# Patient Record
Sex: Female | Born: 1945 | Race: Black or African American | Hispanic: No | Marital: Single | Smoking: Never smoker
Health system: Southern US, Community
[De-identification: ages and names within clinical notes are randomized; demographics above are authoritative.]

## PROBLEM LIST (undated history)

## (undated) DIAGNOSIS — K219 Gastro-esophageal reflux disease without esophagitis: Secondary | ICD-10-CM

## (undated) DIAGNOSIS — H9190 Unspecified hearing loss, unspecified ear: Secondary | ICD-10-CM

## (undated) DIAGNOSIS — I10 Essential (primary) hypertension: Secondary | ICD-10-CM

## (undated) HISTORY — PX: TUBAL LIGATION: SHX77

---

## 2008-05-04 ENCOUNTER — Emergency Department (HOSPITAL_COMMUNITY): Admission: EM | Admit: 2008-05-04 | Discharge: 2008-05-04 | Payer: Self-pay | Admitting: Emergency Medicine

## 2010-04-18 ENCOUNTER — Emergency Department (HOSPITAL_COMMUNITY): Admission: EM | Admit: 2010-04-18 | Discharge: 2010-04-18 | Payer: Self-pay | Admitting: Emergency Medicine

## 2010-04-24 ENCOUNTER — Encounter: Admission: RE | Admit: 2010-04-24 | Discharge: 2010-04-24 | Payer: Self-pay | Admitting: Family Medicine

## 2011-02-19 LAB — URINALYSIS, ROUTINE W REFLEX MICROSCOPIC
Glucose, UA: NEGATIVE mg/dL
Hgb urine dipstick: NEGATIVE
Protein, ur: NEGATIVE mg/dL
Specific Gravity, Urine: 1.008 (ref 1.005–1.030)
pH: 6 (ref 5.0–8.0)

## 2011-02-19 LAB — URINE MICROSCOPIC-ADD ON

## 2011-08-30 LAB — COMPREHENSIVE METABOLIC PANEL
AST: 21
BUN: 5 — ABNORMAL LOW
CO2: 25
GFR calc Af Amer: >60
Glucose, Bld: 98
Sodium: 139
Total Bilirubin: 0.7

## 2011-08-30 LAB — DIFFERENTIAL
Basophils Relative: 1
Eosinophils Relative: 0
Lymphocytes Relative: 15
Monocytes Absolute: 0.2
Monocytes Relative: 2 — ABNORMAL LOW

## 2011-08-30 LAB — URINALYSIS, ROUTINE W REFLEX MICROSCOPIC
Ketones, ur: NEGATIVE
Specific Gravity, Urine: 1.001 — ABNORMAL LOW
pH: 7

## 2011-08-30 LAB — CBC
Hemoglobin: 14.1
MCHC: 33.4
RDW: 14.2

## 2011-08-30 LAB — POCT CARDIAC MARKERS: Myoglobin, poc: 51.6

## 2011-08-30 LAB — PREGNANCY, URINE: Preg Test, Ur: NEGATIVE

## 2011-08-30 LAB — LIPASE, BLOOD: Lipase: 16

## 2012-03-30 ENCOUNTER — Emergency Department (HOSPITAL_COMMUNITY)
Admission: EM | Admit: 2012-03-30 | Discharge: 2012-03-30 | Disposition: A | Discharge status: Home / Self Care | Payer: Medicare Other | Attending: Emergency Medicine | Admitting: Emergency Medicine

## 2012-03-30 ENCOUNTER — Encounter (HOSPITAL_COMMUNITY): Payer: Self-pay | Admitting: Emergency Medicine

## 2012-03-30 DIAGNOSIS — H209 Unspecified iridocyclitis: Secondary | ICD-10-CM | POA: Insufficient documentation

## 2012-03-30 DIAGNOSIS — I10 Essential (primary) hypertension: Secondary | ICD-10-CM | POA: Insufficient documentation

## 2012-03-30 DIAGNOSIS — K219 Gastro-esophageal reflux disease without esophagitis: Secondary | ICD-10-CM | POA: Insufficient documentation

## 2012-03-30 HISTORY — DX: Essential (primary) hypertension: I10

## 2012-03-30 HISTORY — DX: Gastro-esophageal reflux disease without esophagitis: K21.9

## 2012-03-30 MED ORDER — TETRACAINE HCL 0.5 % OP SOLN
OPHTHALMIC | Status: AC
  Administered 2012-03-30: 2 [drp]
  Filled 2012-03-30: qty 2

## 2012-03-30 MED ORDER — ACETAMINOPHEN-CODEINE #3 300-30 MG PO TABS: 1.0000 | ORAL_TABLET | Freq: Four times a day (QID) | ORAL | Status: AC | PRN

## 2012-03-30 MED ORDER — ACETAMINOPHEN-CODEINE #3 300-30 MG PO TABS
1.0000 | ORAL_TABLET | Freq: Once | ORAL | Status: AC
  Administered 2012-03-30: 1 via ORAL
  Filled 2012-03-30: qty 1

## 2012-03-30 MED ORDER — FLUORESCEIN SODIUM 1 MG OP STRP
1.0000 | ORAL_STRIP | Freq: Once | OPHTHALMIC | Status: DC
  Filled 2012-03-30: qty 1

## 2012-03-30 MED ORDER — BACITRA-NEOMYCIN-POLYMYXIN-HC 1 % OP OINT: TOPICAL_OINTMENT | Freq: Two times a day (BID) | OPHTHALMIC | Status: AC

## 2012-03-30 NOTE — ED Notes (Addendum)
Interpreter at bedside. Discharge instructions explained, pt to follow up with [facility]  tomorrow. Had no further questions.

## 2012-03-30 NOTE — Discharge Instructions (Signed)
Call Dr. Huel Coventry office first thing in the morning to schedule close followup appointment tomorrow. Explain that you were seen in the emergency department and that Dr. Burgess Estelle wanted you to be seen tomorrow. Use eyedrops as directed. Use Tylenol #3 for pain. Return to emergency department for any emergent changing or worsening of symptoms.  Iritis Iritis is an inflammation of the colored part of the eye (iris). Other parts at the front of the eye may also be inflamed. The iris is part of the middle layer of the eyeball which is called the uvea or the uveal track. Any part of the uveal track can become inflamed. The other portions of the uveal track are the choroid (the thin membrane under the outer layer of the eye), and the ciliary body (joins the choroid and the iris and produces the fluid in the front of the eye).  It is extremely important to treat iritis early, as it may lead to internal eye damage causing scarring or diseases such as glaucoma. Some people have only one attack of iritis (in one or both eyes) in their lifetime, while others may get it many times. CAUSES Iritis can be associated with many different diseases, but mostly occurs in otherwise healthy people. Examples of diseases that can be associated with iritis include:  Diseases where the body's immune system attacks tissues within your own body (autoimmune diseases).   Infections (tuberculosis, gonorrhea, fungus infections, Lyme disease, infection of the lining of the heart).   Trauma or injury.   Eye diseases (acute glaucoma and others).   Inflammation from other parts of the uveal track.   Severe eye infections.   Other rare diseases.  SYMPTOMS  Eye pain or aching.   Sensitivity to light.   Loss of sight or blurred vision.   Redness of the eye. This is often accompanied by a ring of redness around the outside of the cornea, or clear covering at the front of the eye (ciliary flush).   Excessive tearing of the  eye(s).   A small pupil that does not enlarge in the dark and stays smaller than the other eye's pupil.   A whitish area that obscures the lower part of the colored circular iris. Sometimes this is visible when looking at the eye, where the whitish area has a "fluid level" or flat top. This is called a "hypopyon" and is actually pus inside the eye.  Since iritis causes the eye to become red, it is often confused with a much less dangerous form of "pink eye" or conjunctivitis. One of the most important symptoms is sensitivity to light. Anytime there is redness, discomfort in the eye(s) and extreme light sensitivity, it is extremely important to see an ophthalmologist as soon as possible. TREATMENT Acute iritis requires prompt medical evaluation by an eye specialist (ophthalmologist.) Treatment depends on the underlying cause but may include:  Corticosteroid eye drops and dilating eye drops. Follow your caregiver's exact instructions on taking and stopping corticosteroid medications (drops or pills).   Occasionally, the iritis will be so severe that it will not respond to commonly used medications. If this happens, it may be necessary to use steroid injections. The injections are given under the eye's outer surface. Sometimes oral medications are given. The decision on treatment used for iritis is usually made on an individual basis.  HOME CARE INSTRUCTIONS Your care giver will give specific instructions regarding the use of eye medications or other medications. Be certain to follow all instructions in both  taking and stopping the medications. SEEK IMMEDIATE MEDICAL CARE IF:  You have redness of one or both eye.   You experience a great deal of light sensitivity.   You have pain or aching in either eye.  MAKE SURE YOU:   Understand these instructions.   Will watch your condition.   Will get help right away if you are not doing well or get worse.  Document Released: 11/19/2005 Document  Revised: 11/08/2011 Document Reviewed: 05/09/2007 [facility] Patient Information 2012 [place], [place].

## 2012-03-30 NOTE — ED Provider Notes (Signed)
History     CSN: [id number]  Arrival date & time 03/30/12  4401   First MD Initiated Contact with Patient 03/30/12 0602      Chief Complaint  Patient presents with  . Eye Pain    (Consider location/radiation/quality/duration/timing/severity/associated sxs/prior treatment) HPI  Patient presents to ER complaining of right eye irritation, redness and pain. 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 Patient states that yesterday she began to have some "itching in eye" and that it progressed and worsened throughout the day with increasing pain as the day progressed. Patient denies known injury to eye. Patient states she recently moved by to [facility] from [facility] but that she has been having "wavey" vision since 1985 and while in [place] in 2010 she had similar symptoms where she had increase in the "wavey" vision associated with pain, itching and watery eye and states she went to a "rehab place for deaf and blind and had a lot of eye tests done and then got new glasses." Patient states symptoms improved over time but that she no longer has those eye glasses, rather a pair she bought at [employer]. She states that when irritation and pain began yesterday, the wavy vision became worse and she felt like she was "seeing hazy like ghost like spots in vision." Patient states eye waters clear drainage. She does not have an eye doctor in [place]. Patient complains that pain in eye is irritated by sun light. Denies pain or irritation in left eye.   Past Medical History  Diagnosis Date  . GERD (gastroesophageal reflux disease)   . Hypertension     No past surgical history on file.  No family history on file.  History  Substance Use Topics  . Smoking status: Not on file  . Smokeless tobacco: Not on file  . Alcohol Use:     OB History    Grav Para Term Preterm Abortions TAB SAB Ect Mult Living                  Review of Systems  All other systems  reviewed and are negative.    Allergies  Aspirin; Ciprofloxacin; and Ibuprofen  Home Medications   Current Outpatient Rx  Name Route Sig Dispense Refill  . LISINOPRIL 10 MG PO TABS Oral Take 10 mg by mouth daily.    Marland Kitchen METOPROLOL TARTRATE 25 MG PO TABS Oral Take 25 mg by mouth 2 (two) times daily.    . NEOMYCIN-POLYMYXIN-HC 3.5-10000-1 OP SUSP  1 drop every 6 (six) hours.    Marland Kitchen NITROGLYCERIN 0.4 MG SL SUBL Sublingual Place 0.4 mg under the tongue every 5 (five) minutes as needed. For chest pain    . OMEPRAZOLE 20 MG PO CPDR Oral Take 20 mg by mouth daily.      BP 146/93  Pulse 89  Temp(Src) 98.3 F (36.8 C) (Oral)  Resp 18  SpO2 98%  Physical Exam  Nursing note and vitals reviewed. Constitutional: She is oriented to person, place, and time. She appears well-developed and well-nourished. No distress.  HENT:  Head: Normocephalic and atraumatic.  Eyes: EOM are normal. Pupils are equal, round, and reactive to light. Right eye exhibits discharge. Right eye exhibits no chemosis and no exudate. No foreign body present in the right eye. Left eye exhibits no chemosis, no discharge, no exudate and no hordeolum. No foreign body present in the left eye. Right conjunctiva is injected. Right conjunctiva has no hemorrhage. Left  conjunctiva is not injected. Left conjunctiva has no hemorrhage.  Fundoscopic exam:      The right eye shows no exudate and no hemorrhage.       The left eye shows no exudate and no hemorrhage.  Slit lamp exam:      The right eye shows no corneal abrasion, no corneal flare and no fluorescein uptake.       Pressure of right eye measured twice at 18.   20/40 vision bilaterally.   Neck: Normal range of motion. Neck supple.  Cardiovascular: Normal rate, regular rhythm, normal heart sounds and intact distal pulses.  Exam reveals no gallop and no friction rub.   No murmur heard. Pulmonary/Chest: Effort normal and breath sounds normal. No respiratory distress. She has no  wheezes. She has no rales. She exhibits no tenderness.  Musculoskeletal: Normal range of motion.  Neurological: She is alert and oriented to person, place, and time.  Skin: Skin is warm and dry. No rash noted. She is not diaphoretic. No erythema.  Psychiatric: She has a normal mood and affect.    ED Course  Procedures (including critical care time)  7:29 AM Dr. Bebe Shaggy to bedside for additional evaluation. I spoke with Dr. Burgess Estelle who states that with normal eye pressure, equal vision bilaterally, no corneal abrasion or FB seen without signs or symptoms of acute glaucoma in the setting of a multiple year hx of "wavey" vision that she is appropriate to close follow up in office tomorrow  The interpreter at bedside states that she knows the patient from living in [place] prior to moving to [facility] and that she and her agency have worked with patient on numerous occasions. Patient voices her understanding that she will have her living facility aid her in calling Dr. Huel Coventry office first thing in the morning to establish f/u appt tomorrow. Interpretor states that agency will assist in visit tomorrow. Dr. Burgess Estelle took patient's information.   Will send patient out with new cortisporin drops since that is the medication she was on in 2010 for same symptoms after eye follow up.   Labs Reviewed - No data to display No results found.   1. Iritis       MDM  See ED course. No signs or symptoms of acute glaucoma. No obvious infectious source. Question early iritis vs mild conjunctivitis with chronic vision changes.         [place]ness [place], [place] 03/30/12 1109

## 2012-03-30 NOTE — ED Provider Notes (Signed)
Pt seen with PA She has right eye pain/redness No consensual pain, IOP less than 20 but does have some conjunctival erythema Will need eye followup  Joya Gaskins, MD 03/30/12 [phone]

## 2012-03-30 NOTE — ED Notes (Signed)
PER EMS- States eye became irritated yesterday. Pain and swelling has progressed until now. Eye is inflamed and red. History of HTN, GERD.

## 2012-03-31 NOTE — ED Provider Notes (Signed)
Medical screening examination/treatment/procedure(s) were conducted as a shared visit with non-physician practitioner(s) and myself.  I personally evaluated the patient during the encounter   Joya Gaskins, MD 03/31/12 [phone]

## 2012-09-30 ENCOUNTER — Observation Stay (HOSPITAL_COMMUNITY)
Admission: EM | Admit: 2012-09-30 | Discharge: 2012-10-02 | Disposition: A | Discharge status: Home / Self Care | Payer: Medicare Other | Attending: Family Medicine | Admitting: Family Medicine

## 2012-09-30 ENCOUNTER — Encounter (HOSPITAL_COMMUNITY): Payer: Self-pay | Admitting: Neurology

## 2012-09-30 ENCOUNTER — Emergency Department (HOSPITAL_COMMUNITY): Payer: Medicare Other

## 2012-09-30 DIAGNOSIS — R079 Chest pain, unspecified: Principal | ICD-10-CM

## 2012-09-30 DIAGNOSIS — R112 Nausea with vomiting, unspecified: Secondary | ICD-10-CM | POA: Insufficient documentation

## 2012-09-30 DIAGNOSIS — H9190 Unspecified hearing loss, unspecified ear: Secondary | ICD-10-CM | POA: Insufficient documentation

## 2012-09-30 DIAGNOSIS — R197 Diarrhea, unspecified: Secondary | ICD-10-CM | POA: Insufficient documentation

## 2012-09-30 DIAGNOSIS — K219 Gastro-esophageal reflux disease without esophagitis: Secondary | ICD-10-CM

## 2012-09-30 LAB — URINALYSIS, ROUTINE W REFLEX MICROSCOPIC
Bilirubin Urine: NEGATIVE
Glucose, UA: NEGATIVE mg/dL
Specific Gravity, Urine: 1.003 — ABNORMAL LOW (ref 1.005–1.030)
Urobilinogen, UA: 0.2 mg/dL (ref 0.0–1.0)

## 2012-09-30 LAB — CBC WITH DIFFERENTIAL/PLATELET
Basophils Relative: 0 % (ref 0–1)
Eosinophils Absolute: 0 10*3/uL (ref 0.0–0.7)
Eosinophils Relative: 0 % (ref 0–5)
HCT: 41.8 % (ref 36.0–46.0)
MCH: 31.4 pg (ref 26.0–34.0)
MCV: 91.9 fL (ref 78.0–100.0)
Monocytes Absolute: 0.3 10*3/uL (ref 0.1–1.0)
WBC: 7.6 10*3/uL (ref 4.0–10.5)

## 2012-09-30 LAB — COMPREHENSIVE METABOLIC PANEL
AST: 16 U/L (ref 0–37)
BUN: 16 mg/dL (ref 6–23)
CO2: 26 mEq/L (ref 19–32)
Calcium: 9.4 mg/dL (ref 8.4–10.5)
Chloride: 106 mEq/L (ref 96–112)
Glucose, Bld: 89 mg/dL (ref 70–99)
Sodium: 141 mEq/L (ref 135–145)
Total Bilirubin: 0.2 mg/dL — ABNORMAL LOW (ref 0.3–1.2)
Total Protein: 7.9 g/dL (ref 6.0–8.3)

## 2012-09-30 LAB — TROPONIN I: Troponin I: <0.3 ng/mL (ref <0.30)

## 2012-09-30 LAB — CBC
HCT: 40.5 % (ref 36.0–46.0)
Hemoglobin: 14 g/dL (ref 12.0–15.0)
MCV: 91.2 fL (ref 78.0–100.0)
RBC: 4.44 MIL/uL (ref 3.87–5.11)
RDW: 13.3 % (ref 11.5–15.5)
WBC: 8.1 10*3/uL (ref 4.0–10.5)

## 2012-09-30 LAB — POCT I-STAT TROPONIN I: Troponin i, poc: 0.01 ng/mL (ref 0.00–0.08)

## 2012-09-30 LAB — URINE MICROSCOPIC-ADD ON

## 2012-09-30 LAB — HEMOGLOBIN A1C: Mean Plasma Glucose: 120 mg/dL — ABNORMAL HIGH (ref <117)

## 2012-09-30 MED ORDER — ONDANSETRON HCL 4 MG/2ML IJ SOLN: 4.0000 mg | Freq: Four times a day (QID) | INTRAMUSCULAR | Status: DC | PRN

## 2012-09-30 MED ORDER — ONDANSETRON HCL 4 MG/2ML IJ SOLN
4.0000 mg | Freq: Once | INTRAMUSCULAR | Status: AC
  Administered 2012-09-30: 4 mg via INTRAVENOUS
  Filled 2012-09-30: qty 2

## 2012-09-30 MED ORDER — MORPHINE SULFATE 4 MG/ML IJ SOLN
4.0000 mg | Freq: Once | INTRAMUSCULAR | Status: AC
  Administered 2012-09-30: 4 mg via INTRAVENOUS
  Filled 2012-09-30: qty 1

## 2012-09-30 MED ORDER — OXYBUTYNIN CHLORIDE 5 MG PO TABS
5.0000 mg | ORAL_TABLET | Freq: Three times a day (TID) | ORAL | Status: DC
  Administered 2012-09-30 – 2012-10-02 (×6): 5 mg via ORAL
  Filled 2012-09-30 (×10): qty 1

## 2012-09-30 MED ORDER — MORPHINE SULFATE 2 MG/ML IJ SOLN: 1.0000 mg | INTRAMUSCULAR | Status: DC | PRN

## 2012-09-30 MED ORDER — ONDANSETRON HCL 4 MG PO TABS: 4.0000 mg | ORAL_TABLET | Freq: Four times a day (QID) | ORAL | Status: DC | PRN

## 2012-09-30 MED ORDER — LISINOPRIL 10 MG PO TABS
10.0000 mg | ORAL_TABLET | Freq: Every day | ORAL | Status: DC
  Administered 2012-09-30 – 2012-10-01 (×2): 10 mg via ORAL
  Filled 2012-09-30 (×3): qty 1

## 2012-09-30 MED ORDER — METOPROLOL SUCCINATE ER 25 MG PO TB24
25.0000 mg | ORAL_TABLET | Freq: Every day | ORAL | Status: DC
  Administered 2012-09-30: 25 mg via ORAL
  Filled 2012-09-30 (×3): qty 1

## 2012-09-30 MED ORDER — ONDANSETRON HCL 4 MG/2ML IJ SOLN: 4.0000 mg | Freq: Three times a day (TID) | INTRAMUSCULAR | Status: AC | PRN

## 2012-09-30 MED ORDER — SODIUM CHLORIDE 0.9 % IJ SOLN
3.0000 mL | Freq: Two times a day (BID) | INTRAMUSCULAR | Status: DC
  Administered 2012-09-30 – 2012-10-01 (×3): 3 mL via INTRAVENOUS

## 2012-09-30 MED ORDER — GI COCKTAIL ~~LOC~~
30.0000 mL | Freq: Once | ORAL | Status: AC
  Administered 2012-09-30: 30 mL via ORAL
  Filled 2012-09-30: qty 30

## 2012-09-30 MED ORDER — ACETAMINOPHEN 650 MG RE SUPP: 650.0000 mg | Freq: Four times a day (QID) | RECTAL | Status: DC | PRN

## 2012-09-30 MED ORDER — LEVALBUTEROL HCL 0.63 MG/3ML IN NEBU
0.6300 mg | INHALATION_SOLUTION | Freq: Four times a day (QID) | RESPIRATORY_TRACT | Status: DC | PRN
  Filled 2012-09-30: qty 3

## 2012-09-30 MED ORDER — ACETAMINOPHEN 325 MG PO TABS
650.0000 mg | ORAL_TABLET | Freq: Four times a day (QID) | ORAL | Status: DC | PRN
  Filled 2012-09-30: qty 2

## 2012-09-30 MED ORDER — PANTOPRAZOLE SODIUM 40 MG PO TBEC
40.0000 mg | DELAYED_RELEASE_TABLET | Freq: Every day | ORAL | Status: DC
  Administered 2012-09-30 – 2012-10-02 (×3): 40 mg via ORAL
  Filled 2012-09-30 (×3): qty 1

## 2012-09-30 MED ORDER — ENOXAPARIN SODIUM 40 MG/0.4ML ~~LOC~~ SOLN
40.0000 mg | SUBCUTANEOUS | Status: DC
  Administered 2012-09-30 – 2012-10-01 (×2): 40 mg via SUBCUTANEOUS
  Filled 2012-09-30 (×3): qty 0.4

## 2012-09-30 NOTE — ED Provider Notes (Signed)
History     CSN: [id number]  Arrival date & time 09/30/12  1000   First MD Initiated Contact with Patient 09/30/12 1019      Chief Complaint  Patient presents with  . Chest Pain    (Consider location/radiation/quality/duration/timing/severity/associated sxs/prior treatment) HPI History obtain with help from sign language interpreter.   Chest pain today. EMS called. Pain described as tightness, 7/10 in intensity. The location of the patient's problem is central chest with no radiation.  Onset was last night. Got worse today. Some improvment since that time.   Modifying factors: better with nitro.  Associated symptoms: Also had some chest pain last night with some vomiting after eating. Not as bad then as this morning.   Past Medical History  Diagnosis Date  . GERD (gastroesophageal reflux disease)   . Hypertension     History reviewed. No pertinent past surgical history.  No family history on file.  History  Substance Use Topics  . Smoking status: Never Smoker   . Smokeless tobacco: Not on file  . Alcohol Use: No    OB History    Grav Para Term Preterm Abortions TAB SAB Ect Mult Living                  Review of Systems Negative for respiratory distress, cough. Positive for vomiting, negative diarrhea.  All other systems reviewed and negative unless noted in HPI.    Allergies  Aspirin; Ciprofloxacin; and Ibuprofen  Home Medications   Current Outpatient Rx  Name Route Sig Dispense Refill  . LISINOPRIL 10 MG PO TABS Oral Take 10 mg by mouth daily.    Marland Kitchen METOPROLOL TARTRATE 25 MG PO TABS Oral Take 25 mg by mouth 2 (two) times daily.    . NEOMYCIN-POLYMYXIN-HC 3.5-10000-1 OP SUSP  1 drop every 6 (six) hours.    Marland Kitchen NITROGLYCERIN 0.4 MG SL SUBL Sublingual Place 0.4 mg under the tongue every 5 (five) minutes as needed. For chest pain    . OMEPRAZOLE 20 MG PO CPDR Oral Take 20 mg by mouth daily.      BP 151/105  Pulse 89  Temp 99.1 F (37.3 C) (Oral)  Resp 16   SpO2 98%  Physical Exam Nursing note and vitals reviewed.  Constitutional: Pt is alert and appears stated age. Oropharynx: Airway open without erythema or exudate. Respiratory: No respiratory distress. Equal breathing bilaterally. CV: Extremities warm and well perfused. Neuro: No motor nor sensory deficit. Head: Normocephalic and atraumatic. Eyes: No conjunctivitis, no scleral icterus. Neck: Supple, no mass. Chest: Non-tender. Abdomen: Soft, non-tender MSK: Extremities are atraumatic without deformity. Skin: No rash, no wounds.  ED Course  Procedures (including critical care time)  Labs Reviewed  CBC WITH DIFFERENTIAL - Abnormal; Notable for the following:    Neutrophils Relative 83 (*)     All other components within normal limits  COMPREHENSIVE METABOLIC PANEL - Abnormal; Notable for the following:    Alkaline Phosphatase 121 (*)     Total Bilirubin 0.2 (*)     GFR calc non Af Amer 88 (*)     All other components within normal limits  MAGNESIUM  POCT I-STAT TROPONIN I   Dg Chest Portable 1 View  09/30/2012  *RADIOLOGY REPORT*  Clinical Data: Chest pain.  PORTABLE CHEST - 1 VIEW  Comparison: No priors.  Findings: Lung volumes are normal.  No consolidative airspace disease.  No pleural effusions.  No pneumothorax.  No pulmonary nodule or mass noted.  Pulmonary vasculature  and the cardiomediastinal silhouette are within normal limits.  IMPRESSION: 1. No radiographic evidence of acute cardiopulmonary disease.   Original Report Authenticated By: Florencia Reasons, M.D.      1. Chest pain       MDM  66 y.o. female here with chest pain.  Pertinent past problems include GERD, HTN. Doubt PE, dissection, esophageal rupture. Concerned for ACS. Pain may be GI related.   Medications/interventions:  GI cocktail, IV morphine, IV zofran.  Data reviewed: EKG ordered and interpreted by me: NSR, RBBB, nonspecific ST-T wave changes and new RBBB when compared to 05/04/2008.  Lab  tests ordered and reviewed by me: CXR without acute disease. I independently viewed the following imaging studies and reviewed radiology's interpretation as summarized: troponin low, CBC, CMP unremarkable.  Course of care: TIMI 0 but Heart score of 5. On re-eval, pt reports being chest pain free. Discussed results and recommendation for admission. Pt agreeable. Hospitalist called. Plan for admission to tele.   Medical Decision Making discussed with ED attending Gerhard Munch, MD          Charm Barges, MD 09/30/12 [phone]

## 2012-09-30 NOTE — H&P (Signed)
[facility] History and Physical  Janet Moore:[id number] DOB: 28-Jan-1946 DOA: 09/30/2012  Referring physician: Charm Barges, MD  PCP: Lolita Patella, MD   Chief Complaint: Chest Pain    HPI:  66 yr old F who comes in with Pain described as tightness, 7/10 in intensity. The location of the patient's problem is central chest with no radiation. Onset was last night. Got worse today.Associated with nausea and vomitting , somewhat: better with nitro. Difficult to communicate  And get better hx as the patient is mute and deaf. She   Has a low grade fever in the ED . Workup essentially negative . No hx of CAD .TIMI 0 but Heart score of 5. On re-eval, pt reports being chest pain free      Review of Systems: negative for the following  Constitutional: Denies fever, chills, diaphoresis, appetite change and fatigue.  HEENT: Denies photophobia, eye pain, redness, hearing loss, ear pain, congestion, sore throat, rhinorrhea, sneezing, mouth sores, trouble swallowing, neck pain, neck stiffness and tinnitus.  Respiratory: Denies SOB, DOE, cough, chest tightness, and wheezing.  Cardiovascular: Denies chest pain, palpitations and leg swelling.  Gastrointestinal: Denies nausea, vomiting, abdominal pain, diarrhea, constipation, blood in stool and abdominal distention.  Genitourinary: Denies dysuria, urgency, frequency, hematuria, flank pain and difficulty urinating.  Musculoskeletal: Denies myalgias, back pain, joint swelling, arthralgias and gait problem.  Skin: Denies pallor, rash and wound.  Neurological: Denies dizziness, seizures, syncope, weakness, light-headedness, numbness and headaches.  Hematological: Denies adenopathy. Easy bruising, personal or family bleeding history  Psychiatric/Behavioral: Denies suicidal ideation, mood changes, confusion, nervousness, sleep disturbance and agitation       Past Medical History  Diagnosis Date  . GERD (gastroesophageal reflux  disease)   . Hypertension      History reviewed. No pertinent past surgical history.    Social History:  reports that she has never smoked. She does not have any smokeless tobacco history on file. She reports that she does not drink alcohol or use illicit drugs.   FAMILY HX REVIEWED AND FOUND TO BE NEGATIVE FOR CAD   Allergies  Allergen Reactions  . Aspirin Other (See Comments)    unknwon  . Ciprofloxacin Other (See Comments)    unknown  . Ibuprofen Other (See Comments)    unknown    No family history on file.   Prior to Admission medications   Medication Sig Start Date End Date Taking? Authorizing Provider  Dexlansoprazole (DEXILANT) 30 MG capsule Take 30 mg by mouth daily.   Yes Historical Provider, MD  lisinopril (PRINIVIL,ZESTRIL) 10 MG tablet Take 10 mg by mouth daily.   Yes Historical Provider, MD     Physical Exam: Filed Vitals:   09/30/12 1008 09/30/12 1203  BP: 151/105 134/69  Pulse: 89 73  Temp: 99.1 F (37.3 C)   TempSrc: Oral   Resp: 16 20  SpO2: 98% 100%     Constitutional: Vital signs reviewed. Patient is a well-developed and well-nourished in no acute distress and cooperative with exam. Alert and oriented x3.  Head: Normocephalic and atraumatic  Ear: TM normal bilaterally  Mouth: no erythema or exudates, MMM  Eyes: PERRL, EOMI, conjunctivae normal, No scleral icterus.  Neck: Supple, Trachea midline normal ROM, No JVD, mass, thyromegaly, or carotid bruit present.  Cardiovascular: RRR, S1 normal, S2 normal, no MRG, pulses symmetric and intact bilaterally  Pulmonary/Chest: CTAB, no wheezes, rales, or rhonchi  Abdominal: Soft. Non-tender, non-distended, bowel sounds are normal, no masses, organomegaly, or guarding present.  GU: no CVA tenderness Musculoskeletal: No joint deformities, erythema, or stiffness, ROM full and no nontender Ext: no edema and no cyanosis, pulses palpable bilaterally (DP and PT)  Hematology: no cervical, inginal, or axillary  adenopathy.  Neurological: A&O x3, Strenght is normal and symmetric bilaterally, cranial nerve II-XII are grossly intact, no focal motor deficit, sensory intact to light touch bilaterally.  Skin: Warm, dry and intact. No rash, cyanosis, or clubbing.  Psychiatric: Normal mood and affect. speech and behavior is normal. Judgment and thought content normal. Cognition and memory are normal.       Labs on Admission:    Basic Metabolic Panel:  Lab 09/30/12 4098  NA 141  K 4.0  CL 106  CO2 26  GLUCOSE 89  BUN 16  CREATININE 0.74  CALCIUM 9.4  MG 2.1  PHOS --   Liver Function Tests:  Lab 09/30/12 1039  AST 16  ALT 14  ALKPHOS 121*  BILITOT 0.2*  PROT 7.9  ALBUMIN 3.6   No results found for this basename: LIPASE:5,AMYLASE:5 in the last 168 hours No results found for this basename: AMMONIA:5 in the last 168 hours CBC:  Lab 09/30/12 1039  WBC 7.6  NEUTROABS 6.4  HGB 14.3  HCT 41.8  MCV 91.9  PLT 253   Cardiac Enzymes: No results found for this basename: CKTOTAL:5,CKMB:5,CKMBINDEX:5,TROPONINI:5 in the last 168 hours  BNP (last 3 results) No results found for this basename: PROBNP:3 in the last 8760 hours    CBG: No results found for this basename: GLUCAP:5 in the last 168 hours  Radiological Exams on Admission: Dg Chest Portable 1 View  09/30/2012  *RADIOLOGY REPORT*  Clinical Data: Chest pain.  PORTABLE CHEST - 1 VIEW  Comparison: No priors.  Findings: Lung volumes are normal.  No consolidative airspace disease.  No pleural effusions.  No pneumothorax.  No pulmonary nodule or mass noted.  Pulmonary vasculature and the cardiomediastinal silhouette are within normal limits.  IMPRESSION: 1. No radiographic evidence of acute cardiopulmonary disease.   Original Report Authenticated By: Florencia Reasons, M.D.     EKG: Independently reviewed. EKG ordered and interpreted by me: NSR, RBBB, nonspecific ST-T wave changes and new RBBB when compared to 05/04/2008.     Assessment/Plan Active Problems:  Chest pain  GERD (gastroesophageal reflux disease)   Chest pain, Doubt PE, dissection, esophageal rupture, could be pleurisy  due to viral URI .will cycle cardiac enzymes , 2 d echo , ALLERGIC TO ASA,   HTN CONTINUE LISINOPRIL , BETABLOCKER ,  GERD will start PPI   Code Status:   full Family Communication: bedside Disposition Plan: admit   Time spent: 70 mins   [facility] Pager [phone]  If 7PM-7AM, please contact night-coverage [email or web address] Password [email or web address] 09/30/2012, 2:18 PM

## 2012-09-30 NOTE — ED Notes (Addendum)
[facility] pt is [facility]. Communicating with EMS via paper and pen. Pt holding chest, EMS unable to get further information. Given 2 nitro, refusing aspirin. Skin warm and dry. EKG showing right bundle branch block. HR 100, BP 192/100. 20 gauge L. AC.  Sign language Interpreter paged. Will continue to monitor.

## 2012-09-30 NOTE — Plan of Care (Signed)
Problem: Phase I Progression Outcomes Goal: Aspirin unless contraindicated Outcome: Not Applicable Date Met:  09/30/12 Pt refused; states she's allergic

## 2012-09-30 NOTE — ED Notes (Addendum)
Pt has many questions about medication refills and medicare questions. EDP in room at this time. Pt reports CP stopped after 2nd dose of SL nitro given by EMS.

## 2012-09-30 NOTE — ED Notes (Signed)
Waiting for sign language interpreter. Communicating with patient with pen and paper. Pt communicated epigastric pain last night after eating, vomiting x 2, diarrhea.

## 2012-10-01 LAB — HEMOGLOBIN A1C
Hgb A1c MFr Bld: 5.6 % (ref <5.7)
Mean Plasma Glucose: 114 mg/dL (ref <117)

## 2012-10-01 LAB — COMPREHENSIVE METABOLIC PANEL
ALT: 10 U/L (ref 0–35)
AST: 17 U/L (ref 0–37)
CO2: 26 mEq/L (ref 19–32)
Calcium: 8.5 mg/dL (ref 8.4–10.5)
Creatinine, Ser: 0.9 mg/dL (ref 0.50–1.10)
GFR calc non Af Amer: 66 mL/min — ABNORMAL LOW (ref >90)
Sodium: 139 mEq/L (ref 135–145)
Total Protein: 6.2 g/dL (ref 6.0–8.3)

## 2012-10-01 LAB — TROPONIN I: Troponin I: <0.3 ng/mL (ref <0.30)

## 2012-10-01 LAB — TSH: TSH: 2.29 u[IU]/mL (ref 0.350–4.500)

## 2012-10-01 MED ORDER — METOPROLOL SUCCINATE 12.5 MG HALF TABLET
12.5000 mg | ORAL_TABLET | Freq: Every day | ORAL | Status: DC
  Administered 2012-10-01 – 2012-10-02 (×2): 12.5 mg via ORAL
  Filled 2012-10-01 (×2): qty 1

## 2012-10-01 MED ORDER — INFLUENZA VIRUS VACC SPLIT PF IM SUSP
0.5000 mL | Freq: Once | INTRAMUSCULAR | Status: AC
  Administered 2012-10-01: 0.5 mL via INTRAMUSCULAR
  Filled 2012-10-01: qty 0.5

## 2012-10-01 NOTE — Progress Notes (Signed)
PROGRESS NOTE  Janet Moore:[id number] DOB: Dec 28, 1945 DOA: 09/30/2012 PCP: Lolita Patella, MD  Brief narrative: 66 yr old female admit 10/29 with Chest pain-on close questioning patient says that she had an episode of enteritis but but 3 days ago and then developed chest pain as well as diarrhea. She had one episode of vomiting and diarrhea that was brown in color not bloody.  This was subsequent to eating a meal of Rice-A-Roni at home. She states and no other sick contacts had this issue. She states she has 0 and 10 chest pain right now. She states that she has been told in the past that she has heart issues when she was in [place].   Past medical history-As per Problem list Chart reviewed as below- Emergency room visits-no other medical history available at present  Consultants:  None  Procedures:  HbA1c 5.6  Chest x-ray 1 view negative  Antibiotics:  None at present   Subjective  She denies any cough any fever but states that she is somewhat dizzy today. She has no chest pain whatsoever. She has run out of her blood pressure medications for the past 2-3 months and has not seen her primary care physician 2 years given she cannot afford the co-pay.   Objective    Interim History: Past uneventful night  Telemetry: Telemetry normal sinus rhythm no red alarms. Limited discontinued 10/01/2012  Objective: Filed Vitals:   09/30/12 1530 09/30/12 1603 09/30/12 2100 10/01/12 0500  BP: 113/66 132/77 99/61 94/61   Pulse: 69 78 66 56  Temp:  98.3 F (36.8 C) 98.5 F (36.9 C) 98.2 F (36.8 C)  TempSrc:      Resp: 19 20 16 16   SpO2: 98% 100% 97% 98%    Intake/Output Summary (Last 24 hours) at 10/01/12 0836 Last data filed at 10/01/12 0500  Gross per 24 hour  Intake    480 ml  Output      0 ml  Net    480 ml    Exam:  General: Alert pleasant oriented Afro-American female-is clinically deaf and history was taken with help of an interpreter at  bedside Cardiovascular: S1-S2 no murmur rub or gallop the PMI displacement Respiratory: Clinically clear no tactile vocal resonance or fremitus Abdomen: Midline lower abdominal scar compatible with hysterectomy Skin soft nontender nondistended, no le edeam or rash Neuro motor grossly normal sensation grossly intact  Data Reviewed: Basic Metabolic Panel:  Lab 10/01/12 9562 09/30/12 1039  NA 139 141  K 3.8 4.0  CL 106 106  CO2 26 26  GLUCOSE 96 89  BUN 20 16  CREATININE 0.90 0.74  CALCIUM 8.5 9.4  MG -- 2.1  PHOS -- --   Liver Function Tests:  Lab 10/01/12 0426 09/30/12 1039  AST 17 16  ALT 10 14  ALKPHOS 109 121*  BILITOT 0.1* 0.2*  PROT 6.2 7.9  ALBUMIN 2.7* 3.6   No results found for this basename: LIPASE:5,AMYLASE:5 in the last 168 hours No results found for this basename: AMMONIA:5 in the last 168 hours CBC:  Lab 09/30/12 1610 09/30/12 1039  WBC 8.1 7.6  NEUTROABS -- 6.4  HGB 14.0 14.3  HCT 40.5 41.8  MCV 91.2 91.9  PLT 245 253   Cardiac Enzymes:  Lab 10/01/12 0426 09/30/12 2144 09/30/12 1609  CKTOTAL -- -- --  CKMB -- -- --  CKMBINDEX -- -- --  TROPONINI <0.30 <0.30 <0.30   BNP: No components found with this basename: POCBNP:5 CBG: No  results found for this basename: GLUCAP:5 in the last 168 hours  No results found for this or any previous visit (from the past 240 hour(s)).   Studies:              All Imaging reviewed and is as per above notation   Scheduled Meds:   . enoxaparin (LOVENOX) injection  40 mg Subcutaneous Q24H  . gi cocktail  30 mL Oral Once  . lisinopril  10 mg Oral Daily  . metoprolol succinate  25 mg Oral Daily  .  morphine injection  4 mg Intravenous Once  . ondansetron (ZOFRAN) IV  4 mg Intravenous Once  . oxybutynin  5 mg Oral TID  . pantoprazole  40 mg Oral Daily  . sodium chloride  3 mL Intravenous Q12H   Continuous Infusions:    Assessment/Plan: 1. Likely gastroenteritis-patient stabilized at present. No role for  an echocardiogram at present time, see below 2. ? 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 I will obtain records from [facility] prior to ordering any further imaging studies. Patient does not have a PCP: 4. This might present a problem in the near future. We'll try to coordinate with case management 3. Borderline hypertension-blood pressures in the 90s over 60s this morning.-This might be secondary to reimplementation of lisinopril 10 mg, metoprolol 25 mg-I. have already change metoprolol to 12.5 XL daily. I do not think patient will be able to afford XL dose, therefore we will give her 12.5 twice a day. We will records from [facility] 4. Question of urinary incontinence-patient has been placed on oxybutynin 5 mg 3 times a day-she'll need urodynamic studies versus urgent continence questionnaire which will be administered to her today 5. Deafness-will need coordination of care  Code Status: Resume full Family Communication: None at bedside Disposition Plan: Likely back to nursing home in the morning.   Pleas Koch, MD  [facility] Pager [phone] 10/01/2012, 8:36 AM    LOS: 1 day

## 2012-10-01 NOTE — Clinical Social Work Psychosocial (Signed)
     Clinical Social Work Department BRIEF PSYCHOSOCIAL ASSESSMENT 10/01/2012  Patient:  Janet Moore, Janet Moore     Account Number:  [id number]     Admit date:  09/30/2012  Clinical Social Worker:  Margaree Mackintosh  Date/Time:  10/01/2012 12:00 M  Referred by:  Physician  Date Referred:  10/01/2012 Referred for  SNF Placement   Other Referral:   Interview type:  Patient Other interview type:   With interpreter present.    PSYCHOSOCIAL DATA Living Status:  ALONE Admitted from facility:   Level of care:   Primary support name:  Unknown. Primary support relationship to patient:   Degree of support available:   Unknown.    CURRENT CONCERNS Current Concerns  Post-Acute Placement   Other Concerns:    SOCIAL WORK ASSESSMENT / PLAN Clinical Social Worker recieved referral indicating pt is from a facility.  CSW reviewed chart and arranged for an interpreter to arrive at the hospital.  CSW met with pt. CSW introduced self, explained role, and provided support. Pt reports she lives alone in an apartment and does not recieve any additional assistance.  Pt inquired about diapers and extra pants.  CSW staffed case with RNCM. CSW to sign off at this time, please re consult if needed.   Assessment/plan status:  Information/Referral to [facility] Other assessment/ plan:   Information/referral to community resources:   [facility]    PATIENTS/FAMILYS RESPONSE TO PLAN OF CARE: Pt was pleasant and engaged in conversation.  Pt thanked CSW for intervention.

## 2012-10-01 NOTE — ED Provider Notes (Signed)
  I performed a history and physical examination of Janet Moore and discussed her management with Dr. Gregary Cromer.  I agree with the history, physical, assessment, and plan of care, with the following exceptions: None  I saw the ECG, relevant labs and studies - I agree with the interpretation.   Elyse Jarvis, MD 10/01/12 [phone]

## 2012-10-02 MED ORDER — LISINOPRIL 5 MG PO TABS
5.0000 mg | ORAL_TABLET | Freq: Every day | ORAL | Status: DC
  Administered 2012-10-02: 5 mg via ORAL
  Filled 2012-10-02: qty 1

## 2012-10-02 MED ORDER — OXYBUTYNIN CHLORIDE 5 MG PO TABS: 5.0000 mg | ORAL_TABLET | Freq: Three times a day (TID) | ORAL | Status: DC

## 2012-10-02 NOTE — Care Management Note (Signed)
    Page 1 of 1   10/02/2012     1:43:34 PM   CARE MANAGEMENT NOTE 10/02/2012  Patient:  Janet Moore, Janet Moore   Account Number:  [id number]  Date Initiated:  09/30/2012  Documentation initiated by:  GRAVES-BIGELOW,Janet Moore  Subjective/Objective Assessment:   Pt admitted with cp.     Action/Plan:   CM will continue to monitor for disposition needs.   Anticipated DC Date:  10/01/2012   Anticipated DC Plan:  HOME/SELF CARE      DC Planning Services  CM consult      Choice offered to / List presented to:             Status of service:  Completed, signed off Medicare Important Message given?   (If response is "NO", the following Medicare IM given date fields will be blank) Date Medicare IM given:   Date Additional Medicare IM given:    Discharge Disposition:  HOME/SELF CARE  Per UR Regulation:  Reviewed for med. necessity/level of care/duration of stay  If discussed at Long Length of Stay Meetings, dates discussed:    Comments:  10-02-12 1538 Janet Muller, Janet Moore,Janet Moore [phone] CM did speak to pt at length with interpreter in room. Pt is not agreeable to hh services of Janet Moore and SW. CM discussed medicaitons with pt and that she needs to f/u with MD Janet Moore. Pt has a blanace that she is paying down and the office will still help her to make an appintment. CM did speak to Janet Moore her therapist from [facility] [phone]. She was willing to pick the pt up and provide transportation home, however pt refused. Janet Moore will meet the pt at home and she will help her get her medicaions. Janet Moore was contacted for bus pass and clothing. No furhter needs from CM at this time.

## 2012-10-02 NOTE — Discharge Summary (Signed)
Physician Discharge Summary  Janet Moore:[id number] DOB: 08-30-1946 DOA: 09/30/2012  PCP: Lolita Patella, MD  Admit date: 09/30/2012 Discharge date: 10/02/2012  Time spent: 20 minutes  Recommendations for Outpatient Follow-up:  1. Needs BMEt and CBC in about 1 month 2. Needs coordination with PCP 3. Would NOT work up CP any further without her old notes from [place] where she was apparently hospitalized in 2011  Discharge Diagnoses:  Active Problems:  Chest pain  GERD (gastroesophageal reflux disease)   Discharge Condition: good  Diet recommendation: regular  Filed Weights   10/01/12 1533  Weight: 82.781 kg (182 lb 8 oz)    History of present illness:  66 yr old AAF admitted with likley non-cardiogenic CP on 10/29. On closer questionnng she admitted to having  an episode of enteritis about  3 days prior to admit and then developed chest pain as well as diarrhea. She had one episode of vomiting and diarrhea that was brown in color not bloody. This was subsequent to eating a meal of Rice-A-Roni at home. She states and no other sick contacts had this issue. She states she has 0 and 10 chest pain right now. She states that she has been told in the past that she has heart issues when she was in [facility] Course:  1. Likely gastroenteritis-patient stabilized at present. No role for an echocardiogram at present time.  Defer to PCP further managment 2. ? 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 This might present a problem in the near future. We'll try to coordinate with case management 3. Borderline hypertension-blood pressures in the 90s over 60s this morning.-This might be secondary to reimplementation of lisinopril 10 mg, metoprolol 25 mg-I. have already change metoprolol to 12.5 XL daily. Given her blood pressures sustained lowe in hospital, she was not  discharged on any htn meds but might need low dose ace implemented as an out-patient 4. Question of urinary incontinence-patient has been placed on oxybutynin 5 mg 3 times a day-she'll need urodynamic studies 5. Deafness-will need coordination of care  Procedures:  See below (i.e. Studies not automatically included, echos, thoracentesis, etc; not x-rays)  Consultations:  none  Discharge Exam: Filed Vitals:   10/01/12 1000 10/01/12 1533 10/01/12 2100 10/02/12 0500  BP: 104/72 101/51 99/54 104/59  Pulse: 88 60 65 65  Temp:  98.1 F (36.7 C) 98.3 F (36.8 C) 98.1 F (36.7 C)  TempSrc:  Oral Oral Oral  Resp:  18    Weight:  82.781 kg (182 lb 8 oz)    SpO2:  99% 99% 100%   Alert pleasant oriented, evaluated c help of interpreter  General: nad, no ict, poor dentition Cardiovascular: s1 s2 no m/r/g Respiratory: clear  Discharge Instructions  Discharge Orders    Future Orders Please Complete By Expires   Diet - low sodium heart healthy      Increase activity slowly      Call MD for:  temperature >100.4      Call MD for:  persistant nausea and vomiting      Call MD for:  severe uncontrolled pain      Call MD for:  redness, tenderness, or signs of infection (pain, swelling, redness, odor or green/yellow discharge around incision site)      Call MD for:  hives      Call MD for:  persistant dizziness or light-headedness  Medication List     As of 10/02/2012 12:35 PM    TAKE these medications         DEXILANT 30 MG capsule   Generic drug: Dexlansoprazole   Take 30 mg by mouth daily.      lisinopril 10 MG tablet   Commonly known as: PRINIVIL,ZESTRIL   Take 10 mg by mouth daily.      oxybutynin 5 MG tablet   Commonly known as: DITROPAN   Take 1 tablet (5 mg total) by mouth 3 (three) times daily.          The results of significant diagnostics from this hospitalization (including imaging, microbiology, ancillary and laboratory) are listed below for  reference.    Significant Diagnostic Studies: Dg Chest Portable 1 View  09/30/2012  *RADIOLOGY REPORT*  Clinical Data: Chest pain.  PORTABLE CHEST - 1 VIEW  Comparison: No priors.  Findings: Lung volumes are normal.  No consolidative airspace disease.  No pleural effusions.  No pneumothorax.  No pulmonary nodule or mass noted.  Pulmonary vasculature and the cardiomediastinal silhouette are within normal limits.  IMPRESSION: 1. No radiographic evidence of acute cardiopulmonary disease.   Original Report Authenticated By: Florencia Reasons, M.D.     Microbiology: No results found for this or any previous visit (from the past 240 hour(s)).   Labs: Basic Metabolic Panel:  Lab 10/01/12 7829 09/30/12 1039  NA 139 141  K 3.8 4.0  CL 106 106  CO2 26 26  GLUCOSE 96 89  BUN 20 16  CREATININE 0.90 0.74  CALCIUM 8.5 9.4  MG -- 2.1  PHOS -- --   Liver Function Tests:  Lab 10/01/12 0426 09/30/12 1039  AST 17 16  ALT 10 14  ALKPHOS 109 121*  BILITOT 0.1* 0.2*  PROT 6.2 7.9  ALBUMIN 2.7* 3.6   No results found for this basename: LIPASE:5,AMYLASE:5 in the last 168 hours No results found for this basename: AMMONIA:5 in the last 168 hours CBC:  Lab 09/30/12 1610 09/30/12 1039  WBC 8.1 7.6  NEUTROABS -- 6.4  HGB 14.0 14.3  HCT 40.5 41.8  MCV 91.2 91.9  PLT 245 253   Cardiac Enzymes:  Lab 10/01/12 0426 09/30/12 2144 09/30/12 1609  CKTOTAL -- -- --  CKMB -- -- --  CKMBINDEX -- -- --  TROPONINI <0.30 <0.30 <0.30   BNP: BNP (last 3 results) No results found for this basename: PROBNP:3 in the last 8760 hours CBG: No results found for this basename: GLUCAP:5 in the last 168 hours     Signed:  Rhetta Mura  [facility] 10/02/2012, 12:35 PM

## 2012-10-02 NOTE — Progress Notes (Signed)
Clinical Social Worker received referral indicating pt in need of bus pass and Building services engineer for dc home.  CSW provided both.  Pt chose not to wear shirt and pulled a shirt from her own bag to wear.  Pt wrote a letter to CSW stating, "I'm Hungry" and "I didn't get grilled cheese Sandwich".  CSW phoned nutritional support who stated pt could have either a salad or a sand which and pt selected a salad-pt's lunch tray still in room.  CSW offered a [place] sandwich from pt fridge, pt declined.  CSW to sign off, please re consult if needed.   Angelia Mould, MSW, [place] [phone]

## 2012-10-02 NOTE — Progress Notes (Signed)
Pt was given Discharge papers interpreter in to explain all discharge orders , S/S of infection s and RX meds ,chest pain and GERD pt understood care follow up

## 2014-04-22 ENCOUNTER — Other Ambulatory Visit: Payer: Self-pay | Admitting: Specialist

## 2014-04-22 DIAGNOSIS — R109 Unspecified abdominal pain: Secondary | ICD-10-CM

## 2014-04-27 ENCOUNTER — Other Ambulatory Visit: Payer: Medicare Other

## 2014-05-04 ENCOUNTER — Other Ambulatory Visit: Payer: Medicare Other

## 2014-05-07 ENCOUNTER — Ambulatory Visit
Admission: RE | Admit: 2014-05-07 | Discharge: 2014-05-07 | Disposition: A | Discharge status: Home / Self Care | Payer: Medicare Other | Source: Ambulatory Visit | Attending: Specialist | Admitting: Specialist

## 2014-05-07 DIAGNOSIS — R109 Unspecified abdominal pain: Secondary | ICD-10-CM

## 2015-03-22 ENCOUNTER — Other Ambulatory Visit: Payer: Self-pay | Admitting: Family Medicine

## 2015-03-22 ENCOUNTER — Ambulatory Visit
Admission: RE | Admit: 2015-03-22 | Discharge: 2015-03-22 | Disposition: A | Discharge status: Home / Self Care | Payer: 59 | Source: Ambulatory Visit | Attending: Family Medicine | Admitting: Family Medicine

## 2015-03-22 DIAGNOSIS — R52 Pain, unspecified: Secondary | ICD-10-CM

## 2015-04-20 ENCOUNTER — Encounter (HOSPITAL_COMMUNITY): Payer: Self-pay | Admitting: *Deleted

## 2015-04-20 ENCOUNTER — Emergency Department (HOSPITAL_COMMUNITY): Payer: Medicare (Managed Care)

## 2015-04-20 ENCOUNTER — Emergency Department (HOSPITAL_COMMUNITY)
Admission: EM | Admit: 2015-04-20 | Discharge: 2015-04-20 | Disposition: A | Discharge status: Home / Self Care | Payer: Medicare (Managed Care) | Attending: Emergency Medicine | Admitting: Emergency Medicine

## 2015-04-20 DIAGNOSIS — I10 Essential (primary) hypertension: Secondary | ICD-10-CM | POA: Diagnosis not present

## 2015-04-20 DIAGNOSIS — M546 Pain in thoracic spine: Secondary | ICD-10-CM | POA: Insufficient documentation

## 2015-04-20 DIAGNOSIS — M549 Dorsalgia, unspecified: Secondary | ICD-10-CM

## 2015-04-20 DIAGNOSIS — R42 Dizziness and giddiness: Secondary | ICD-10-CM | POA: Insufficient documentation

## 2015-04-20 DIAGNOSIS — K219 Gastro-esophageal reflux disease without esophagitis: Secondary | ICD-10-CM | POA: Diagnosis not present

## 2015-04-20 DIAGNOSIS — Z79899 Other long term (current) drug therapy: Secondary | ICD-10-CM | POA: Diagnosis not present

## 2015-04-20 DIAGNOSIS — M542 Cervicalgia: Secondary | ICD-10-CM | POA: Insufficient documentation

## 2015-04-20 DIAGNOSIS — G8929 Other chronic pain: Secondary | ICD-10-CM | POA: Insufficient documentation

## 2015-04-20 LAB — CBC WITH DIFFERENTIAL/PLATELET
BASOS ABS: 0 10*3/uL (ref 0.0–0.1)
Basophils Relative: 0 % (ref 0–1)
EOS ABS: 0 10*3/uL (ref 0.0–0.7)
EOS PCT: 0 % (ref 0–5)
HCT: 46.8 % — ABNORMAL HIGH (ref 36.0–46.0)
Hemoglobin: 15.2 g/dL — ABNORMAL HIGH (ref 12.0–15.0)
LYMPHS PCT: 13 % (ref 12–46)
Lymphs Abs: 1 10*3/uL (ref 0.7–4.0)
MCH: 30.3 pg (ref 26.0–34.0)
MCHC: 32.5 g/dL (ref 30.0–36.0)
MCV: 93.2 fL (ref 78.0–100.0)
Monocytes Absolute: 0.2 10*3/uL (ref 0.1–1.0)
Monocytes Relative: 3 % (ref 3–12)
NEUTROS PCT: 84 % — AB (ref 43–77)
Neutro Abs: 6.5 10*3/uL (ref 1.7–7.7)
PLATELETS: 307 10*3/uL (ref 150–400)
RBC: 5.02 MIL/uL (ref 3.87–5.11)
RDW: 14.3 % (ref 11.5–15.5)
WBC: 7.7 10*3/uL (ref 4.0–10.5)

## 2015-04-20 LAB — I-STAT CHEM 8, ED
BUN: 12 mg/dL (ref 6–20)
CALCIUM ION: 1.14 mmol/L (ref 1.13–1.30)
CHLORIDE: 108 mmol/L (ref 101–111)
CREATININE: 0.7 mg/dL (ref 0.44–1.00)
GLUCOSE: 110 mg/dL — AB (ref 65–99)
HCT: 50 % — ABNORMAL HIGH (ref 36.0–46.0)
Hemoglobin: 17 g/dL — ABNORMAL HIGH (ref 12.0–15.0)
Potassium: 3.6 mmol/L (ref 3.5–5.1)
Sodium: 145 mmol/L (ref 135–145)
TCO2: 22 mmol/L (ref 0–100)

## 2015-04-20 MED ORDER — NAPROXEN 500 MG PO TABS
500.0000 mg | ORAL_TABLET | Freq: Once | ORAL | Status: AC
  Administered 2015-04-20: 500 mg via ORAL
  Filled 2015-04-20: qty 1

## 2015-04-20 MED ORDER — GADOBENATE DIMEGLUMINE 529 MG/ML IV SOLN
20.0000 mL | Freq: Once | INTRAVENOUS | Status: AC | PRN
  Administered 2015-04-20: 17 mL via INTRAVENOUS

## 2015-04-20 MED ORDER — MECLIZINE HCL 25 MG PO TABS
25.0000 mg | ORAL_TABLET | Freq: Once | ORAL | Status: DC
Start: 2015-04-20 — End: 2015-04-20

## 2015-04-20 MED ORDER — SODIUM CHLORIDE 0.9 % IV BOLUS (SEPSIS)
1000.0000 mL | Freq: Once | INTRAVENOUS | Status: AC
  Administered 2015-04-20: 1000 mL via INTRAVENOUS
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
## 2015-04-20 NOTE — ED Provider Notes (Signed)
MSE was initiated and I personally evaluated the patient and placed orders (if any) at  5:35 AM on Apr 20, 2015.  The patient appears stable so that the remainder of the MSE may be completed by another provider. Pt reports in writing she is having neck pain after sleeping on the love seat last night.  She declines medicines or exam until the sign language interpreter arrives.  She is alert and is in no acute distress at this time.   Janet BattiestElizabeth Mehmet Scally, NP 04/20/15 [id number]  Janet Severinlga Otter, MD 04/22/15 [phone]

## 2015-04-20 NOTE — ED Notes (Signed)
Bed: WA17 Expected date: 04/20/15 Expected time: 4:16 AM Means of arrival: Ambulance Comments: Neck pain

## 2015-04-20 NOTE — ED Notes (Signed)
Pt arrives to the ER via EMS for complaints of neck pain; pt is deaf; pt relays thorough EMS that she slept on her neck wrong on the couch and has rt sided neck pain;  Pt denies injury or fall; pt reports that the pain is worse with movement

## 2015-04-20 NOTE — ED Notes (Signed)
[facility] notified for transport 

## 2015-04-20 NOTE — Discharge Instructions (Signed)
Use tylenol or advil for pain. Use heat to your neck and back for 20 minutes every hour as needed for pain. Follow up with neurology in 1 week for recheck of your symptoms, and follow up with [facility] wellness in 1-2 weeks to establish primary care. Return to the ER for changes or worsening symptoms.   Dizziness  Dizziness means you feel unsteady or lightheaded. You might feel like you are going to pass out (faint). HOME CARE   Drink enough fluids to keep your pee (urine) clear or pale yellow.  Take your medicines exactly as told by your doctor. If you take blood pressure medicine, always stand up slowly from the lying or sitting position. Hold on to something to steady yourself.  If you need to stand in one place for a long time, move your legs often. Tighten and relax your leg muscles.  Have someone stay with you until you feel okay.  Do not drive or use heavy machinery if you feel dizzy.  Do not drink alcohol. GET HELP RIGHT AWAY IF:   You feel dizzy or lightheaded and it gets worse.  You feel sick to your stomach (nauseous), or you throw up (vomit).  You have trouble talking or walking.  You feel weak or have trouble using your arms, hands, or legs.  You cannot think clearly or have trouble forming sentences.  You have chest pain, belly (abdominal) pain, sweating, or you are short of breath.  Your vision changes.  You are bleeding.  You have problems from your medicine that seem to be getting worse. MAKE SURE YOU:   Understand these instructions.  Will watch your condition.  Will get help right away if you are not doing well or get worse. Document Released: 11/08/2011 Document Revised: 02/11/2012 Document Reviewed: 11/08/2011 [facility] Patient Information 2015 [place]ExitCare, [place]LL[place]. This information is not intended to replace advice given to you by your health care provider. Make sure you discuss any questions you have with your health care  provider.  Musculoskeletal Pain Musculoskeletal pain is muscle and boney aches and pains. These pains can occur in any part of the body. Your caregiver may treat you without knowing the cause of the pain. They may treat you if blood or urine tests, X-rays, and other tests were normal.  CAUSES There is often not a definite cause or reason for these pains. These pains may be caused by a type of germ (virus). The discomfort may also come from overuse. Overuse includes working out too hard when your body is not fit. Boney aches also come from weather changes. Bone is sensitive to atmospheric pressure changes. HOME CARE INSTRUCTIONS   Ask when your test results will be ready. Make sure you get your test results.  Only take over-the-counter or prescription medicines for pain, discomfort, or fever as directed by your caregiver. If you were given medications for your condition, do not drive, operate machinery or power tools, or sign legal documents for 24 hours. Do not drink alcohol. Do not take sleeping pills or other medications that may interfere with treatment.  Continue all activities unless the activities cause more pain. When the pain lessens, slowly resume normal activities. Gradually increase the intensity and duration of the activities or exercise.  During periods of severe pain, bed rest may be helpful. Lay or sit in any position that is comfortable.  Putting ice on the injured area.  Put ice in a bag.  Place a towel between your skin  and the bag.  Leave the ice on for 15 to 20 minutes, 3 to 4 times a day.  Follow up with your caregiver for continued problems and no reason can be found for the pain. If the pain becomes worse or does not go away, it may be necessary to repeat tests or do additional testing. Your caregiver may need to look further for a possible cause. SEEK IMMEDIATE MEDICAL CARE IF:  You have pain that is getting worse and is not relieved by medications.  You develop  chest pain that is associated with shortness or breath, sweating, feeling sick to your stomach (nauseous), or throw up (vomit).  Your pain becomes localized to the abdomen.  You develop any new symptoms that seem different or that concern you. MAKE SURE YOU:   Understand these instructions.  Will watch your condition.  Will get help right away if you are not doing well or get worse. Document Released: 11/19/2005 Document Revised: 02/11/2012 Document Reviewed: 07/24/2013 [facility] Patient Information 2015 [place]ExitCare, MarylandLLC. This information is not intended to replace advice given to you by your health care provider. Make sure you discuss any questions you have with your health care provider.  Chronic Back Pain  When back pain lasts longer than 3 months, it is called chronic back pain.People with chronic back pain often go through certain periods that are more intense (flare-ups).  CAUSES Chronic back pain can be caused by wear and tear (degeneration) on different structures in your back. These structures include:  The bones of your spine (vertebrae) and the joints surrounding your spinal cord and nerve roots (facets).  The strong, fibrous tissues that connect your vertebrae (ligaments). Degeneration of these structures may result in pressure on your nerves. This can lead to constant pain. HOME CARE INSTRUCTIONS  Avoid bending, heavy lifting, prolonged sitting, and activities which make the problem worse.  Take brief periods of rest throughout the day to reduce your pain. Lying down or standing usually is better than sitting while you are resting.  Take over-the-counter or prescription medicines only as directed by your caregiver. SEEK IMMEDIATE MEDICAL CARE IF:   You have weakness or numbness in one of your legs or feet.  You have trouble controlling your bladder or bowels.  You have nausea, vomiting, abdominal pain, shortness of breath, or fainting. Document Released: 12/27/2004  Document Revised: 02/11/2012 Document Reviewed: 11/03/2011 [facility]Exit[facility] Patient Information 2015 [place]ExitCare, MarylandLLC. This information is not intended to replace advice given to you by your health care provider. Make sure you discuss any questions you have with your health care provider.

## 2015-04-20 NOTE — ED Notes (Signed)
Patient transported to MRI 

## 2015-04-20 NOTE — ED Notes (Signed)
Orthostatic vitals incomplete.  Pt sts to interpreter that she feels dizzy when she sits on the edge of the bed, pt also sts that when the PA ask her to stand she almost faint.  Vitals were done with pt laying and sitting on edge of bed.  PA France RavensMercedes and RN Tiffany notifed

## 2015-04-20 NOTE — ED Notes (Signed)
Pt unable to urinate at this time.  

## 2015-04-20 NOTE — ED Notes (Signed)
Awaiting sign language interpreter

## 2015-06-29 ENCOUNTER — Ambulatory Visit: Payer: Medicare (Managed Care) | Admitting: Neurology

## 2016-01-06 DIAGNOSIS — I10 Essential (primary) hypertension: Secondary | ICD-10-CM | POA: Diagnosis not present

## 2016-01-06 DIAGNOSIS — R5382 Chronic fatigue, unspecified: Secondary | ICD-10-CM | POA: Diagnosis not present

## 2016-01-06 DIAGNOSIS — M79609 Pain in unspecified limb: Secondary | ICD-10-CM | POA: Diagnosis not present

## 2016-01-06 DIAGNOSIS — R6889 Other general symptoms and signs: Secondary | ICD-10-CM | POA: Diagnosis not present

## 2016-01-06 DIAGNOSIS — M1990 Unspecified osteoarthritis, unspecified site: Secondary | ICD-10-CM | POA: Diagnosis not present

## 2016-02-22 DIAGNOSIS — Z743 Need for continuous supervision: Secondary | ICD-10-CM | POA: Diagnosis not present

## 2016-02-22 DIAGNOSIS — J811 Chronic pulmonary edema: Secondary | ICD-10-CM | POA: Diagnosis not present

## 2016-02-22 DIAGNOSIS — K529 Noninfective gastroenteritis and colitis, unspecified: Secondary | ICD-10-CM | POA: Diagnosis not present

## 2016-02-22 DIAGNOSIS — R079 Chest pain, unspecified: Secondary | ICD-10-CM | POA: Diagnosis not present

## 2016-02-22 DIAGNOSIS — R9431 Abnormal electrocardiogram [ECG] [EKG]: Secondary | ICD-10-CM | POA: Diagnosis not present

## 2016-02-22 DIAGNOSIS — H913 Deaf nonspeaking, not elsewhere classified: Secondary | ICD-10-CM | POA: Diagnosis not present

## 2016-02-22 DIAGNOSIS — I4510 Unspecified right bundle-branch block: Secondary | ICD-10-CM | POA: Diagnosis not present

## 2018-06-17 ENCOUNTER — Encounter: Payer: Self-pay | Admitting: Pediatric Intensive Care

## 2018-07-01 ENCOUNTER — Emergency Department (HOSPITAL_COMMUNITY)
Admission: EM | Admit: 2018-07-01 | Discharge: 2018-07-01 | Disposition: A | Discharge status: Home / Self Care | Payer: Medicare HMO | Attending: Emergency Medicine | Admitting: Emergency Medicine

## 2018-07-01 ENCOUNTER — Emergency Department (HOSPITAL_COMMUNITY): Payer: Medicare HMO

## 2018-07-01 ENCOUNTER — Encounter (HOSPITAL_COMMUNITY): Payer: Self-pay

## 2018-07-01 ENCOUNTER — Encounter: Payer: Self-pay | Admitting: Pediatric Intensive Care

## 2018-07-01 DIAGNOSIS — I10 Essential (primary) hypertension: Secondary | ICD-10-CM | POA: Diagnosis not present

## 2018-07-01 DIAGNOSIS — J40 Bronchitis, not specified as acute or chronic: Secondary | ICD-10-CM

## 2018-07-01 DIAGNOSIS — Z79899 Other long term (current) drug therapy: Secondary | ICD-10-CM | POA: Diagnosis not present

## 2018-07-01 DIAGNOSIS — J209 Acute bronchitis, unspecified: Secondary | ICD-10-CM | POA: Diagnosis not present

## 2018-07-01 DIAGNOSIS — R05 Cough: Secondary | ICD-10-CM | POA: Diagnosis present

## 2018-07-01 DIAGNOSIS — J029 Acute pharyngitis, unspecified: Secondary | ICD-10-CM | POA: Diagnosis not present

## 2018-07-01 LAB — COMPREHENSIVE METABOLIC PANEL
ALT: 13 U/L (ref 0–44)
AST: 18 U/L (ref 15–41)
Albumin: 4 g/dL (ref 3.5–5.0)
Alkaline Phosphatase: 114 U/L (ref 38–126)
Anion gap: 6 (ref 5–15)
BUN: 12 mg/dL (ref 8–23)
CHLORIDE: 109 mmol/L (ref 98–111)
CO2: 31 mmol/L (ref 22–32)
CREATININE: 0.82 mg/dL (ref 0.44–1.00)
Calcium: 9.1 mg/dL (ref 8.9–10.3)
GFR calc Af Amer: >60 mL/min (ref >60)
GFR calc non Af Amer: >60 mL/min (ref >60)
GLUCOSE: 106 mg/dL — AB (ref 70–99)
POTASSIUM: 4.2 mmol/L (ref 3.5–5.1)
SODIUM: 146 mmol/L — AB (ref 135–145)
Total Bilirubin: 0.8 mg/dL (ref 0.3–1.2)
Total Protein: 8.7 g/dL — ABNORMAL HIGH (ref 6.5–8.1)

## 2018-07-01 LAB — CBC WITH DIFFERENTIAL/PLATELET
Basophils Absolute: 0 10*3/uL (ref 0.0–0.1)
Basophils Relative: 0 %
Eosinophils Absolute: 0.1 10*3/uL (ref 0.0–0.7)
Eosinophils Relative: 1 %
HCT: 46.7 % — ABNORMAL HIGH (ref 36.0–46.0)
Hemoglobin: 15.5 g/dL — ABNORMAL HIGH (ref 12.0–15.0)
LYMPHS ABS: 2.2 10*3/uL (ref 0.7–4.0)
LYMPHS PCT: 23 %
MCH: 31.2 pg (ref 26.0–34.0)
MCHC: 33.2 g/dL (ref 30.0–36.0)
MCV: 94 fL (ref 78.0–100.0)
Monocytes Absolute: 0.7 10*3/uL (ref 0.1–1.0)
Monocytes Relative: 7 %
Neutro Abs: 6.7 10*3/uL (ref 1.7–7.7)
Neutrophils Relative %: 69 %
PLATELETS: 342 10*3/uL (ref 150–400)
RBC: 4.97 MIL/uL (ref 3.87–5.11)
RDW: 14.7 % (ref 11.5–15.5)
WBC: 9.8 10*3/uL (ref 4.0–10.5)

## 2018-07-01 LAB — GROUP A STREP BY PCR: GROUP A STREP BY PCR: NOT DETECTED

## 2018-07-01 MED ORDER — TRAMADOL HCL 50 MG PO TABS: 50.0000 mg | ORAL_TABLET | Freq: Four times a day (QID) | ORAL | 0 refills | Status: DC | PRN

## 2018-07-01 MED ORDER — TRAMADOL HCL 50 MG PO TABS
50.0000 mg | ORAL_TABLET | Freq: Once | ORAL | Status: AC
  Administered 2018-07-01: 50 mg via ORAL
  Filled 2018-07-01: qty 1

## 2018-07-01 MED ORDER — DM-GUAIFENESIN ER 30-600 MG PO TB12: 1.0000 | ORAL_TABLET | Freq: Two times a day (BID) | ORAL | 1 refills | Status: DC

## 2018-07-01 NOTE — Discharge Instructions (Addendum)
Chest x-ray negative for pneumonia.  Strep test for your throat was negative for strep pharyngitis.  Take the tramadol for the throat pain.  Take the Mucinex DM for the cough and congestion.  Return for any new or worse symptoms or follow-up with your doctor or the wellness clinic.
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
## 2018-07-01 NOTE — ED Notes (Signed)
Patient transported to X-ray 

## 2018-07-01 NOTE — Congregational Nurse Program (Signed)
Congregational Nurse Program Note  Date of Encounter: 07/01/2018  Past Medical History: Past Medical History:  Diagnosis Date  . GERD (gastroesophageal reflux disease)   . Hypertension     Encounter Details: CNP Questionnaire - 07/01/18 0930      Questionnaire   Patient Status  Not Applicable    Race  Black or African American    [facility]  [employer] [facility] [employer]    Insurance  Medicare    Uninsured  Not Applicable    Food  Within past 12 months, worried food would run out with no money to buy more    Housing/Utilities  No permanent housing    Transportation  Yes, need transportation assistance;Provided transportation assistance (bus pass, taxi voucher, etc.)    Interpersonal Safety  No, do not feel physically and emotionally safe where you currently live    Medication  No medication insecurities    Medical Provider  No    Referrals  Emergency Department    ED Visit Averted  Not Applicable    Life-Saving Intervention Made  Not Applicable      Client is hearing impaired. Communication via notes. Client states that she has had a cough since last Monday. It is productive and light yellow. BBS- clear but decreased right side. Client has letter stating that her Medicare provider will be [employer] but coverage does not start until 07/03/2018. CN explained to client that she will help find a new provider but that she needs to be evaluated for cough and elevated BP today. [facility] Cab vouchers provided.

## 2018-07-01 NOTE — ED Notes (Signed)
Bed: [facility]ALD Expected date:  Expected time:  Means of arrival:  Comments: 

## 2018-07-01 NOTE — ED Provider Notes (Signed)
[facility]-EMERGENCY DEPT Provider Note   CSN: [id number] Arrival date & time: 07/01/18  1016     History   Chief Complaint Chief Complaint  Patient presents with  . Cough    HPI Janet Moore is a 72 y.o. female.  Patient presents with cough productive yellow sputum is been going on for about a week.  Patient stays at a homeless shelter.  Patient is deaf patient was communicated with using ASL interpreter.  On the iPad.  Patient states that she is felt feverish.  That she has a sore throat productive cough yellow sputum denies any GI symptoms.     Past Medical History:  Diagnosis Date  . GERD (gastroesophageal reflux disease)   . Hypertension     Patient Active Problem List   Diagnosis Date Noted  . Chest pain 09/30/2012  . GERD (gastroesophageal reflux disease) 09/30/2012    Past Surgical History:  Procedure Laterality Date  . CESAREAN SECTION     x2  . TUBAL LIGATION       OB History   None      Home Medications    Prior to Admission medications   Medication Sig Start Date End Date Taking? Authorizing Provider  amoxicillin (AMOXIL) 500 MG capsule Take 500 mg by mouth 2 (two) times daily.    [provider]  Dexlansoprazole (DEXILANT) 30 MG capsule Take 30 mg by mouth daily.     [provider]  dextromethorphan-guaiFENesin (MUCINEX DM) 30-600 MG 12hr tablet Take 1 tablet by mouth 2 (two) times daily. 07/01/18   Vanetta MuldersZackowski, Teri Diltz, MD  lisinopril (PRINIVIL,ZESTRIL) 10 MG tablet Take 10 mg by mouth daily.    [provider]  oxybutynin (DITROPAN) 5 MG tablet Take 1 tablet (5 mg total) by mouth 3 (three) times daily. Patient not taking: Reported on 04/20/2015 10/02/12   Rhetta MuraSamtani, Jai-Gurmukh, MD  traMADol (ULTRAM) 50 MG tablet Take 1 tablet (50 mg total) by mouth every 6 (six) hours as needed. 07/01/18   Vanetta MuldersZackowski, Carinna Newhart, MD    Family History History reviewed. No pertinent family history.  Social  History Social History   Tobacco Use  . Smoking status: Never Smoker  Substance Use Topics  . Alcohol use: No  . Drug use: No     Allergies   Aspirin; Ciprofloxacin; and Ibuprofen   Review of Systems Review of Systems  Constitutional: Positive for fever.  HENT: Positive for congestion and hearing loss.   Eyes: Negative for redness.  Respiratory: Positive for cough.   Cardiovascular: Negative for chest pain.  Gastrointestinal: Negative for abdominal pain, diarrhea, nausea and vomiting.  Genitourinary: Negative for dysuria.  Musculoskeletal: Negative for back pain and myalgias.  Skin: Negative for rash.  Neurological: Positive for speech difficulty. Negative for headaches.  Hematological: Does not bruise/bleed easily.  Psychiatric/Behavioral: Negative for confusion.     Physical Exam Updated Vital Signs BP 139/83 (BP Location: Left Arm)   Pulse 88   Temp 98.7 F (37.1 C) (Oral)   Resp 16   SpO2 97%   Physical Exam  Constitutional: She is oriented to person, place, and time. She appears well-developed and well-nourished. No distress.  HENT:  Head: Normocephalic and atraumatic.  Mouth/Throat: Oropharynx is clear and moist. No oropharyngeal exudate.  Uvula midline some mild erythema no exudate.  No significant swelling.  Eyes: Pupils are equal, round, and reactive to light.  Neck: Normal range of motion. Neck supple.  Cardiovascular: Normal rate, regular rhythm and normal  heart sounds.  Pulmonary/Chest: Effort normal and breath sounds normal. No respiratory distress. She has no wheezes. She has no rales.  Abdominal: Soft. Bowel sounds are normal. There is no tenderness.  Musculoskeletal: Normal range of motion. She exhibits no edema.  Neurological: She is alert and oriented to person, place, and time. No cranial nerve deficit or sensory deficit. She exhibits normal muscle tone. Coordination normal.  Skin: Skin is warm.  Nursing note and vitals reviewed.    ED  Treatments / Results  Labs (all labs ordered are listed, but only abnormal results are displayed) Labs Reviewed  COMPREHENSIVE METABOLIC PANEL - Abnormal; Notable for the following components:      Result Value   Sodium 146 (*)    Glucose, Bld 106 (*)    Total Protein 8.7 (*)    All other components within normal limits  CBC WITH DIFFERENTIAL/PLATELET - Abnormal; Notable for the following components:   Hemoglobin 15.5 (*)    HCT 46.7 (*)    All other components within normal limits  GROUP A STREP BY PCR    EKG None  Radiology Dg Chest 2 View  Result Date: 07/01/2018 CLINICAL DATA:  Cough. EXAM: CHEST - 2 VIEW COMPARISON:  Radiograph of September 30, 2012. FINDINGS: The heart size and mediastinal contours are within normal limits. Both lungs are clear. No pneumothorax or pleural effusion is noted. The visualized skeletal structures are unremarkable. IMPRESSION: No active cardiopulmonary disease. Electronically Signed   By: Lupita Raider, M.D.   On: 07/01/2018 12:27    Procedures Procedures (including critical care time)  Medications Ordered in ED Medications - No data to display   Initial Impression / Assessment and Plan / ED Course  I have reviewed the triage vital signs and the nursing notes.  Pertinent labs & imaging results that were available during my care of the patient were reviewed by me and considered in my medical decision making (see chart for details).     Findings consistent with a bronchitis and pharyngitis.  Rapid strep was negative for strep pharyngitis.  Chest x-ray negative for pneumonia.  Will be treated with tramadol for the throat pain and Mucinex DM.  Follow-up with her primary care doctor.  iPad sign language interpreter used.  Final Clinical Impressions(s) / ED Diagnoses   Final diagnoses:  Bronchitis  Pharyngitis, unspecified etiology    ED Discharge Orders        Ordered    dextromethorphan-guaiFENesin [facility] DM) 30-600 MG 12hr tablet   2 times daily     07/01/18 1615    traMADol (ULTRAM) 50 MG tablet  Every 6 hours PRN     07/01/18 1615       Vanetta Mulders, MD 07/01/18 1622

## 2018-07-18 NOTE — Congregational Nurse Program (Signed)
New client encounter. Client is hearing disabled. Client states through notes that she will have a new Medicare administrator in August and needs help finding a provider. She states she has chronic back pain. Follow up in CN clinic as needed.

## 2018-07-24 ENCOUNTER — Ambulatory Visit: Payer: Medicare (Managed Care) | Admitting: Family Medicine

## 2018-09-29 ENCOUNTER — Telehealth: Payer: Self-pay | Admitting: Pediatric Intensive Care

## 2018-09-29 NOTE — Telephone Encounter (Signed)
Call to Marcille Blanco at [facility]. She had left message regarding need to verify hospital encounter for client on 07/01/2018. CN explained relationship to client and that client is no longer living at the [facility]. CN did verify that client had been evaluated at [facility] ED on 07/01/2018. Case ref # [id number] for claim.

## 2019-08-02 ENCOUNTER — Emergency Department (HOSPITAL_COMMUNITY)
Admission: EM | Admit: 2019-08-02 | Discharge: 2019-08-02 | Disposition: A | Discharge status: Home / Self Care | Payer: Medicare HMO | Attending: Emergency Medicine | Admitting: Emergency Medicine

## 2019-08-02 ENCOUNTER — Encounter (HOSPITAL_COMMUNITY): Payer: Self-pay | Admitting: Emergency Medicine

## 2019-08-02 ENCOUNTER — Other Ambulatory Visit: Payer: Self-pay

## 2019-08-02 DIAGNOSIS — I10 Essential (primary) hypertension: Secondary | ICD-10-CM | POA: Insufficient documentation

## 2019-08-02 DIAGNOSIS — R296 Repeated falls: Secondary | ICD-10-CM | POA: Insufficient documentation

## 2019-08-02 DIAGNOSIS — Y9389 Activity, other specified: Secondary | ICD-10-CM | POA: Insufficient documentation

## 2019-08-02 DIAGNOSIS — M25572 Pain in left ankle and joints of left foot: Secondary | ICD-10-CM | POA: Diagnosis not present

## 2019-08-02 DIAGNOSIS — H913 Deaf nonspeaking, not elsewhere classified: Secondary | ICD-10-CM | POA: Diagnosis not present

## 2019-08-02 DIAGNOSIS — M79605 Pain in left leg: Secondary | ICD-10-CM | POA: Insufficient documentation

## 2019-08-02 DIAGNOSIS — W19XXXA Unspecified fall, initial encounter: Secondary | ICD-10-CM | POA: Insufficient documentation

## 2019-08-02 DIAGNOSIS — Y92811 Bus as the place of occurrence of the external cause: Secondary | ICD-10-CM | POA: Diagnosis not present

## 2019-08-02 DIAGNOSIS — Y999 Unspecified external cause status: Secondary | ICD-10-CM | POA: Diagnosis not present

## 2019-08-02 MED ORDER — ACETAMINOPHEN 500 MG PO TABS: 1000.0000 mg | ORAL_TABLET | Freq: Three times a day (TID) | ORAL | 0 refills | Status: AC

## 2019-08-02 MED ORDER — ACETAMINOPHEN 500 MG PO TABS
1000.0000 mg | ORAL_TABLET | Freq: Once | ORAL | Status: AC
  Administered 2019-08-02: 1000 mg via ORAL
  Filled 2019-08-02: qty 2

## 2019-08-02 NOTE — ED Notes (Addendum)
Called [facility]. [facility] said they don't send vans to transport pts. Representative called to see if pt a [facility] client. Pt is not a [facility] client.

## 2019-08-02 NOTE — ED Notes (Signed)
Pt wants to know if she can bear weight on her foot? She is worried about falling. Feels like she will be off balance with boot. She would like a cane. Messaged provider. I asked how she would get home when she is discharged, she said to call [place], and a [facility] Lucianne Lei will pick her up.

## 2019-08-02 NOTE — ED Notes (Signed)
Pt fnd standing up in room, explained to pt orthro will not be available today until after 12pm, and that she will receive a RX for a can. Pt is  upset that she will not get her cane prior to discharge. [facility] called .

## 2019-08-02 NOTE — ED Notes (Signed)
Using wallE translation service for sign language translation. Translation provided by Apolonio Schneiders [phone]. Explained to pt that doctor prescribed tylenol 1000 mg and that I would apply a cam walker (boot) to her foot.
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
## 2019-08-02 NOTE — ED Notes (Signed)
[facility] arrived to pick up pt.  

## 2019-08-02 NOTE — ED Notes (Signed)
[facility] called  
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
## 2019-08-02 NOTE — ED Notes (Addendum)
Used wallE translation service for sign language. Interpreter Fara Chute [phone]. Explained discharge paperwork to patient. She said, she didn't want to see the doctors on her discharge paperwork because she doesn't know them. She will call the [facility] doctors on her medical card tomorrow. She became upset, said she would sue hospital because a cane wasn't provided. She can't believe that we don't have canes here. Told pt I would call Acquanetta Belling for her.

## 2019-08-02 NOTE — ED Notes (Signed)
Talked to charge. Ortho tech doesn't arrive until 12PM. Dont wait on cane. She has a prescription for a cane. Call [facility] for transport home.

## 2019-08-02 NOTE — ED Notes (Signed)
Called wallE translation service. Janet Moore [phone] translation. Ortho arrives at [place] to pt that ortho would fit her with a cane before she leaves and that we would arrange for [facility] to take her home. Pt said she should have had blood work done. She always has blood work done when she goes to ED. She is upset she didn't have blood work done.

## 2019-08-02 NOTE — ED Provider Notes (Signed)
[facility] Provider Moore  CSN: [id number] Arrival date & time: 08/02/19 [id number]  Chief Complaint(s) Fall  HPI Janet Moore is a 73 y.o. female here with left posterior leg pain following a fall 1 week ago. She reports being on the bus, getting up and falling. She was caught by bystanders, but felt immediate leg pain. Pain is moderate to severe. Worse with palpation of posterior leg and ambulation. Heat also worsened pain. Better with immobilization. She does not take pain medicine. She has been ambulating since fall. Denies other trauma or physical complaints.     The history is provided by the patient. The history is limited by a language barrier. A language interpreter was used.    Past Medical History Past Medical History:  Diagnosis Date  . GERD (gastroesophageal reflux disease)   . Hypertension    Patient Active Problem List   Diagnosis Date Noted  . Chest pain 09/30/2012  . GERD (gastroesophageal reflux disease) 09/30/2012   Home Medication(s) Prior to Admission medications   Medication Sig Start Date End Date Taking? Authorizing Provider  acetaminophen (TYLENOL) 500 MG tablet Take 2 tablets (1,000 mg total) by mouth every 8 (eight) hours for 5 days. Do not take more than 4000 mg of acetaminophen (Tylenol) in a 24-hour period. Please Moore that other medicines that you may be prescribed may have Tylenol as well. 08/02/19 08/07/19  Nira Connardama, Pedro Eduardo, MD                                                                                                                                    Past Surgical History Past Surgical History:  Procedure Laterality Date  . CESAREAN SECTION     x2  . TUBAL LIGATION     Family History History reviewed. No pertinent family history.  Social History Social History   Tobacco Use  . Smoking status: Never Smoker  . Smokeless tobacco: Never Used  Substance Use Topics  . Alcohol use: No  . Drug use:  No   Allergies Aspirin, Ciprofloxacin, and Ibuprofen  Review of Systems Review of Systems All other systems are reviewed and are negative for acute change except as noted in the HPI  Physical Exam Vital Signs  I have reviewed the triage vital signs BP (!) 111/93   Pulse 83   Temp 98.8 F (37.1 C) (Oral)   Resp 20   Ht 5\' 4"  (1.626 m)   Wt 100.7 kg   SpO2 96%   BMI 38.11 kg/m   Physical Exam Constitutional:      General: She is not in acute distress.    Appearance: She is well-developed. She is not diaphoretic.  HENT:     Head: Normocephalic and atraumatic.     Right Ear: External ear normal.     Left Ear: External ear normal.     Nose: Nose normal.  Eyes:  General: No scleral icterus.       Right eye: No discharge.        Left eye: No discharge.     Conjunctiva/sclera: Conjunctivae normal.     Pupils: Pupils are equal, round, and reactive to light.  Neck:     Musculoskeletal: Normal range of motion and neck supple.  Cardiovascular:     Rate and Rhythm: Normal rate and regular rhythm.     Pulses:          Radial pulses are 2+ on the right side and 2+ on the left side.       Dorsalis pedis pulses are 2+ on the right side and 2+ on the left side.     Heart sounds: Normal heart sounds. No murmur. No friction rub. No gallop.   Pulmonary:     Effort: Pulmonary effort is normal. No respiratory distress.     Breath sounds: Normal breath sounds. No stridor. No wheezing.  Abdominal:     General: There is no distension.     Palpations: Abdomen is soft.     Tenderness: There is no abdominal tenderness.  Musculoskeletal:     Left ankle: She exhibits no swelling, no deformity and normal pulse. No tenderness. No lateral malleolus, no medial malleolus and no AITFL tenderness found. Achilles tendon exhibits pain. Achilles tendon exhibits no defect and normal Thompson's test results.     Cervical back: She exhibits no bony tenderness.     Thoracic back: She exhibits no bony  tenderness.     Lumbar back: She exhibits no bony tenderness.     Left lower leg: She exhibits tenderness. She exhibits no bony tenderness, no swelling, no deformity and no laceration. No edema.       Legs:  Skin:    General: Skin is warm and dry.     Findings: No erythema or rash.  Neurological:     Mental Status: She is alert and oriented to person, place, and time.     Comments: Deaf. Mute.     ED Results and Treatments Labs (all labs ordered are listed, but only abnormal results are displayed) Labs Reviewed - No data to display                                                                                                                       EKG  EKG Interpretation  Date/Time:    Ventricular Rate:    PR Interval:    QRS Duration:   QT Interval:    QTC Calculation:   R Axis:     Text Interpretation:        Radiology No results found.  Pertinent labs & imaging results that were available during my care of the patient were reviewed by me and considered in my medical decision making (see chart for details).  Medications Ordered in ED Medications  acetaminophen (TYLENOL) tablet 1,000 mg (1,000 mg Oral Given 08/02/19 0525)  Procedures Procedures  (including critical care time)  Medical Decision Making / ED Course I have reviewed the nursing notes for this encounter and the patient's prior records (if available in EHR or on provided paperwork).   Janet Moore was evaluated in Emergency Department on 08/03/2019 for the symptoms described in the history of present illness. She was evaluated in the context of the global COVID-19 pandemic, which necessitated consideration that the patient might be at risk for infection with the SARS-CoV-2 virus that causes COVID-19. Institutional protocols and algorithms that pertain to the evaluation of  patients at risk for COVID-19 are in a state of rapid change based on information released by regulatory bodies including the CDC and federal and state organizations. These policies and algorithms were followed during the patient's care in the ED.  Left posterior pain s/p fall 1 week ago. NVI distally. TTP localized over distal gastrocnemius/achilles tendon. Intact ROM.  Not suggestive of DVT, arterial occlusion. Possible contusion/hematoma, muscle strain, achilles strain vs tear. Doubt rupture as she has full ROM.  CAM walker provided. Cane Rx given.  The patient appears reasonably screened and/or stabilized for discharge and I doubt any other medical condition or other [facility] requiring further screening, evaluation, or treatment in the ED at this time prior to discharge.  The patient is safe for discharge with strict return precautions.        Final Clinical Impression(s) / ED Diagnoses Final diagnoses:  Leg pain, posterior, left    The patient appears reasonably screened and/or stabilized for discharge and I doubt any other medical condition or other [facility] requiring further screening, evaluation, or treatment in the ED at this time prior to discharge.  Disposition: Discharge  Condition: Good  I have discussed the results, Dx and Tx plan with the patient who expressed understanding and agree(s) with the plan. Discharge instructions discussed at great length. The patient was given strict return precautions who verbalized understanding of the instructions. No further questions at time of discharge.    ED Discharge Orders         Ordered    Cane     08/02/19 0627    acetaminophen (TYLENOL) 500 MG tablet  Every 8 hours     08/02/19 0629    Cane adjustable wide base quad     08/02/19 0847           Follow Up: Maury Dus, MD [address] [place] [address] [phone]  Schedule an appointment as soon as possible for a visit    Mcarthur Rossetti, MD [place] [address] [phone]  Schedule an appointment as soon as possible for a visit  to assess for possible achiles tendon tear      This chart was dictated using voice recognition software.  Despite best efforts to proofread,  errors can occur which can change the documentation meaning.   Fatima Blank, MD 08/03/19 [phone]

## 2019-08-02 NOTE — ED Notes (Addendum)
Using [employer] translation service for sign language translation. Translation provided by Erline Levine #[id number]. Asked pt for feedback on how boot fits. Pt said the boot is comfortable and fits. Pt said she does not have ice at home. I told her I would send her home with an ice pack.

## 2019-08-02 NOTE — ED Notes (Signed)
Talked to charge nurse. Ortho can fit pt for walker when they arrive.

## 2019-09-09 ENCOUNTER — Other Ambulatory Visit: Payer: Self-pay

## 2019-09-09 ENCOUNTER — Emergency Department (HOSPITAL_COMMUNITY): Payer: Medicare HMO

## 2019-09-09 ENCOUNTER — Telehealth (HOSPITAL_COMMUNITY): Payer: Self-pay | Admitting: Emergency Medicine

## 2019-09-09 ENCOUNTER — Emergency Department (HOSPITAL_COMMUNITY)
Admission: EM | Admit: 2019-09-09 | Discharge: 2019-09-09 | Disposition: A | Discharge status: Home / Self Care | Payer: Medicare HMO | Attending: Emergency Medicine | Admitting: Emergency Medicine

## 2019-09-09 ENCOUNTER — Encounter (HOSPITAL_COMMUNITY): Payer: Self-pay | Admitting: Emergency Medicine

## 2019-09-09 DIAGNOSIS — R1013 Epigastric pain: Secondary | ICD-10-CM | POA: Insufficient documentation

## 2019-09-09 DIAGNOSIS — R197 Diarrhea, unspecified: Secondary | ICD-10-CM | POA: Diagnosis not present

## 2019-09-09 DIAGNOSIS — R1084 Generalized abdominal pain: Secondary | ICD-10-CM | POA: Diagnosis not present

## 2019-09-09 DIAGNOSIS — R1110 Vomiting, unspecified: Secondary | ICD-10-CM | POA: Diagnosis not present

## 2019-09-09 DIAGNOSIS — I10 Essential (primary) hypertension: Secondary | ICD-10-CM | POA: Insufficient documentation

## 2019-09-09 DIAGNOSIS — R52 Pain, unspecified: Secondary | ICD-10-CM | POA: Diagnosis not present

## 2019-09-09 DIAGNOSIS — R112 Nausea with vomiting, unspecified: Secondary | ICD-10-CM | POA: Insufficient documentation

## 2019-09-09 HISTORY — DX: Unspecified hearing loss, unspecified ear: H91.90

## 2019-09-09 LAB — COMPREHENSIVE METABOLIC PANEL
ALT: 12 U/L (ref 0–44)
AST: 20 U/L (ref 15–41)
Albumin: 3.6 g/dL (ref 3.5–5.0)
Alkaline Phosphatase: 112 U/L (ref 38–126)
Anion gap: 13 (ref 5–15)
BUN: 13 mg/dL (ref 8–23)
CO2: 22 mmol/L (ref 22–32)
Calcium: 9.1 mg/dL (ref 8.9–10.3)
Chloride: 105 mmol/L (ref 98–111)
Creatinine, Ser: 0.77 mg/dL (ref 0.44–1.00)
GFR calc Af Amer: >60 mL/min (ref >60)
GFR calc non Af Amer: >60 mL/min (ref >60)
Glucose, Bld: 104 mg/dL — ABNORMAL HIGH (ref 70–99)
Potassium: 3.9 mmol/L (ref 3.5–5.1)
Sodium: 140 mmol/L (ref 135–145)
Total Bilirubin: 0.6 mg/dL (ref 0.3–1.2)
Total Protein: 7.7 g/dL (ref 6.5–8.1)

## 2019-09-09 LAB — URINALYSIS, ROUTINE W REFLEX MICROSCOPIC
Bilirubin Urine: NEGATIVE
Glucose, UA: NEGATIVE mg/dL
Ketones, ur: NEGATIVE mg/dL
Nitrite: NEGATIVE
Protein, ur: NEGATIVE mg/dL
Specific Gravity, Urine: 1.011 (ref 1.005–1.030)
pH: 6 (ref 5.0–8.0)

## 2019-09-09 LAB — TROPONIN I (HIGH SENSITIVITY)
Troponin I (High Sensitivity): 7 ng/L (ref <18)
Troponin I (High Sensitivity): 7 ng/L (ref <18)

## 2019-09-09 LAB — CBC
HCT: 45.9 % (ref 36.0–46.0)
Hemoglobin: 15.1 g/dL — ABNORMAL HIGH (ref 12.0–15.0)
MCH: 31.6 pg (ref 26.0–34.0)
MCHC: 32.9 g/dL (ref 30.0–36.0)
MCV: 96 fL (ref 80.0–100.0)
Platelets: 262 10*3/uL (ref 150–400)
RBC: 4.78 MIL/uL (ref 3.87–5.11)
RDW: 14.3 % (ref 11.5–15.5)
WBC: 5.8 10*3/uL (ref 4.0–10.5)
nRBC: 0 % (ref 0.0–0.2)

## 2019-09-09 LAB — LIPASE, BLOOD: Lipase: 24 U/L (ref 11–51)

## 2019-09-09 MED ORDER — AMLODIPINE BESYLATE 2.5 MG PO TABS: 2.5000 mg | ORAL_TABLET | Freq: Every day | ORAL | 0 refills | Status: DC

## 2019-09-09 MED ORDER — FAMOTIDINE 20 MG PO TABS: 20.0000 mg | ORAL_TABLET | Freq: Every day | ORAL | 0 refills | Status: DC

## 2019-09-09 MED ORDER — IOHEXOL 300 MG/ML  SOLN
100.0000 mL | Freq: Once | INTRAMUSCULAR | Status: AC | PRN
  Administered 2019-09-09: 12:00:00 100 mL via INTRAVENOUS

## 2019-09-09 MED ORDER — SODIUM CHLORIDE 0.9% FLUSH
3.0000 mL | Freq: Once | INTRAVENOUS | Status: AC
  Administered 2019-09-09: 3 mL via INTRAVENOUS

## 2019-09-09 MED ORDER — AMLODIPINE BESYLATE 2.5 MG PO TABS: 2.5000 mg | ORAL_TABLET | Freq: Every day | ORAL | 0 refills | Status: AC

## 2019-09-09 MED ORDER — ONDANSETRON HCL 4 MG/2ML IJ SOLN
4.0000 mg | Freq: Once | INTRAMUSCULAR | Status: AC
  Administered 2019-09-09: 4 mg via INTRAVENOUS
  Filled 2019-09-09: qty 2

## 2019-09-09 MED ORDER — FAMOTIDINE 20 MG PO TABS: 20.0000 mg | ORAL_TABLET | Freq: Every day | ORAL | 0 refills | Status: AC

## 2019-09-09 MED ORDER — ONDANSETRON 4 MG PO TBDP: 4.0000 mg | ORAL_TABLET | Freq: Three times a day (TID) | ORAL | 0 refills | Status: AC | PRN

## 2019-09-09 MED ORDER — SODIUM CHLORIDE 0.9 % IV BOLUS
500.0000 mL | Freq: Once | INTRAVENOUS | Status: AC
  Administered 2019-09-09: 500 mL via INTRAVENOUS

## 2019-09-09 MED ORDER — ONDANSETRON 4 MG PO TBDP: 4.0000 mg | ORAL_TABLET | Freq: Three times a day (TID) | ORAL | 0 refills | Status: DC | PRN

## 2019-09-09 NOTE — ED Notes (Signed)
Patient transported to CT 

## 2019-09-09 NOTE — ED Triage Notes (Signed)
Pt to triage via [facility] from [facility].  Reports upper abd pain, nausea, vomiting, and diarrhea that woke pt up a few hours ago.  Pt deaf.

## 2019-09-09 NOTE — ED Provider Notes (Signed)
Emergency Department Provider Note   I have reviewed the triage vital signs and the nursing notes.  History, ROS, and exam performed with in-person ASL interpreter.   HISTORY  Chief Complaint Abdominal Pain   HPI Damian LeavellKharidja A Saez is a 73 y.o. female with past medical history reviewed below presents to the emergency department for evaluation of abdominal pain with nausea and vomiting.  [facility].  She reports upper abdominal bloating and discomfort with associated nausea and vomiting.  The triage note mentions diarrhea but patient states that she has been having normal bowel movements without diarrhea.  She also reports some bleeding from her right nostril yesterday after smelling a bleach type solution.  This stopped and did not require medical intervention.  She was feeling some bloating and discomfort last night but this morning developed large-volume vomiting.  She denies CP or SOB.  Patient states that she has been on medications for similar symptoms in the past but has run out. Denies fever or sick contact.    Past Medical History:  Diagnosis Date   Deaf    GERD (gastroesophageal reflux disease)    Hypertension     Patient Active Problem List   Diagnosis Date Noted   Chest pain 09/30/2012   GERD (gastroesophageal reflux disease) 09/30/2012    Past Surgical History:  Procedure Laterality Date   CESAREAN SECTION     x2   TUBAL LIGATION      Allergies Aspirin, Ciprofloxacin, and Ibuprofen  No family history on file.  Social History Social History   Tobacco Use   Smoking status: Never Smoker   Smokeless tobacco: Never Used  Substance Use Topics   Alcohol use: No   Drug use: No    Review of Systems  Constitutional: No fever/chills Eyes: No visual changes. ENT: No sore throat. Cardiovascular: Denies chest pain. Respiratory: Denies shortness of  breath. Gastrointestinal: Positive epigastric abdominal pain. Positive nausea and vomiting.  No diarrhea.  No constipation. Genitourinary: Negative for dysuria. Musculoskeletal: Negative for back pain. Skin: Negative for rash. Neurological: Negative for headaches, focal weakness or numbness.  10-point ROS otherwise negative.  ____________________________________________   PHYSICAL EXAM:  VITAL SIGNS: ED Triage Vitals  Enc Vitals Group     BP 09/09/19 0352 (!) 170/131     Pulse Rate 09/09/19 0352 94     Resp 09/09/19 0352 18     Temp 09/09/19 0352 98.7 F (37.1 C)     Temp Source 09/09/19 0352 Oral     SpO2 09/09/19 0352 96 %   Constitutional: Alert and oriented. Well appearing and in no acute distress. Eyes: Conjunctivae are normal.  Head: Atraumatic. Nose: No congestion/rhinnorhea. Mouth/Throat: Mucous membranes are moist.  Neck: No stridor.  Cardiovascular: Normal rate, regular rhythm. Good peripheral circulation. Grossly normal heart sounds.   Respiratory: Normal respiratory effort.  No retractions. Lungs CTAB. Gastrointestinal: Soft with mild epigastric discomfort. No distention.  Musculoskeletal: No lower extremity tenderness nor edema. No gross deformities of extremities. Neurologic:  Normal speech and language. No gross focal neurologic deficits are appreciated.  Skin:  Skin is warm, dry and intact. No rash noted.  ____________________________________________   LABS (all labs ordered are listed, but only abnormal results are displayed)  Labs Reviewed  COMPREHENSIVE METABOLIC PANEL - Abnormal; Notable for the following components:      Result Value   Glucose, Bld 104 (*)    All other components within normal  limits  CBC - Abnormal; Notable for the following components:   Hemoglobin 15.1 (*)    All other components within normal limits  URINALYSIS, ROUTINE W REFLEX MICROSCOPIC - Abnormal; Notable for the following components:   Color, Urine STRAW (*)    Hgb  urine dipstick SMALL (*)    Leukocytes,Ua SMALL (*)    Bacteria, UA RARE (*)    All other components within normal limits  LIPASE, BLOOD  TROPONIN I (HIGH SENSITIVITY)  TROPONIN I (HIGH SENSITIVITY)   ____________________________________________  EKG   EKG Interpretation  Date/Time:  Wednesday September 09 2019 10:16:35 EDT Ventricular Rate:  80 PR Interval:    QRS Duration: 135 QT Interval:  427 QTC Calculation: 493 R Axis:   -93 Text Interpretation:  Sinus rhythm RBBB and LAFB ST elevation suggests acute pericarditis Baseline wander in lead(s) V1 Similar to 2016 tracing. No STEMI  Confirmed by Nanda Quinton [id number]) on 09/09/2019 10:20:46 AM       ____________________________________________  RADIOLOGY  Ct Abdomen Pelvis W Contrast  Result Date: 09/09/2019 CLINICAL DATA:  Abdominal pain, nausea, vomiting and diarrhea beginning today. EXAM: CT ABDOMEN AND PELVIS WITH CONTRAST TECHNIQUE: Multidetector CT imaging of the abdomen and pelvis was performed using the standard protocol following bolus administration of intravenous contrast. CONTRAST:  100 mL OMNIPAQUE IOHEXOL 300 MG/ML  SOLN COMPARISON:  None. FINDINGS: Lower chest: Very small bilateral pleural effusions are seen, larger on the left. Heart size is enlarged. No pericardial effusion. Lung bases clear. Hepatobiliary: No focal liver abnormality is seen. No gallstones, gallbladder wall thickening, or biliary dilatation. Pancreas: Unremarkable. No pancreatic ductal dilatation or surrounding inflammatory changes. Spleen: Normal in size without focal abnormality. Adrenals/Urinary Tract: Adrenal glands are unremarkable. Kidneys are normal, without renal calculi, focal lesion, or hydronephrosis. Bladder is unremarkable. Stomach/Bowel: Stomach is within normal limits. Appendix appears normal. No evidence of bowel wall thickening, distention, or inflammatory changes. Scattered diverticula noted. Vascular/Lymphatic: No significant vascular  findings are present. No enlarged abdominal or pelvic lymph nodes. Reproductive: Uterus and bilateral adnexa are unremarkable. Other: Small fat containing umbilical hernia is seen. Musculoskeletal: No acute or focal bony abnormality. L5-S1 degenerative disc disease noted. IMPRESSION: No acute abnormality abdomen or pelvis. No finding to explain the patient's symptoms. Mild diverticulosis without diverticulitis. Small fat containing umbilical hernia. Electronically Signed   By: Inge Rise M.D.   On: 09/09/2019 12:33    ____________________________________________   PROCEDURES  Procedure(s) performed:   Procedures  None  ____________________________________________   INITIAL IMPRESSION / ASSESSMENT AND PLAN / ED COURSE  Pertinent labs & imaging results that were available during my care of the patient were reviewed by me and considered in my medical decision making (see chart for details).   Patient presents to the emergency department primarily with epigastric discomfort with nausea vomiting.  Likely gastritis.  Lower suspicion for bowel obstruction.  Lower suspicion for atypical ACS.  Do plan for EKG along with troponin.  Lab work reviewed which is obtained from triage which shows no acute findings.  CT abdomen pelvis pending given her focal discomfort on exam and to rule out stricture/obstruction.  CT imaging reviewed with no acute findings.  Lab work is unremarkable.  Patient feeling improved here in the emergency department.  Will discharge home with low-dose amlodipine for elevated blood pressures.  Unable to find patient's old medications with nothing filled since 2019. Will discharge with Zofran and Pepcid. Patient to call PCP this afternoon for next available appointment.   Social  work has seen the patient and arranged home health referral. Placed Face to Face order.  ____________________________________________  FINAL CLINICAL IMPRESSION(S) / ED DIAGNOSES  Final diagnoses:   Epigastric pain  Non-intractable vomiting with nausea, unspecified vomiting type     MEDICATIONS GIVEN DURING THIS VISIT:  Medications  sodium chloride flush (NS) 0.9 % injection 3 mL (3 mLs Intravenous Given 09/09/19 1007)  ondansetron (ZOFRAN) injection 4 mg (4 mg Intravenous Given 09/09/19 1003)  sodium chloride 0.9 % bolus 500 mL (0 mLs Intravenous Stopped 09/09/19 1045)  iohexol (OMNIPAQUE) 300 MG/ML solution 100 mL (100 mLs Intravenous Contrast Given 09/09/19 1148)     NEW OUTPATIENT MEDICATIONS STARTED DURING THIS VISIT:  New Prescriptions   AMLODIPINE (NORVASC) 2.5 MG TABLET    Take 1 tablet (2.5 mg total) by mouth daily.   FAMOTIDINE (PEPCID) 20 MG TABLET    Take 1 tablet (20 mg total) by mouth daily.   ONDANSETRON (ZOFRAN ODT) 4 MG DISINTEGRATING TABLET    Take 1 tablet (4 mg total) by mouth every 8 (eight) hours as needed for nausea or vomiting.    Note:  This document was prepared using Dragon voice recognition software and may include unintentional dictation errors.  Alona Bene, MD, [facility] Emergency Medicine    Genine Beckett, Arlyss Repress, MD 09/09/19 [phone]

## 2019-09-09 NOTE — TOC Progression Note (Signed)
Transition of Care [facility]) - Progression Note    Patient Details  Name: RENLEE FLOOR MRN: [id number] Date of Birth: 1946-05-17  [facility]) CM/SW Contact  Fuller Mandril, RN Phone Number: 09/09/2019, 2:08 PM  Clinical Narrative:    [facility] met with pt and interpreter, [place] to discuss home health services.  Pt was given choice of [place] agencies and chose [facility].   Expected Discharge Plan: [place] Barriers to Discharge: Barriers Resolved  Expected Discharge Plan and Services Expected Discharge Plan: [place]   Discharge Planning Services: CM Consult   Living arrangements for the past 2 months: [place]: RN [facility] Date [place]: 09/09/19 Time [place]: 1406 Representative spoke with at [place]: [place] (SDOH) Interventions    Readmission Risk Interventions No flowsheet data found.

## 2019-09-09 NOTE — TOC Transition Note (Signed)
Transition of Care [facility]) - CM/SW Discharge Note   Patient Details  Name: Janet Moore MRN: [id number] Date of Birth: 1946-04-03  Transition of Care [facility]) CM/SW Contact:  Fuller Mandril, RN Phone Number: 09/09/2019, 2:29 PM   Clinical Narrative:    Pt will return home with [employer] transportation.    Final next level of care: [place] Barriers to Discharge: Barriers Resolved   Patient Goals and CMS Choice Patient states their goals for this hospitalization and ongoing recovery are:: have nurse help check on me                             Discharge Plan and Services   Discharge Planning Services: CM Consult                      HH Arranged: RN [facility] Agency: [facility] [place] Date [place]: 09/09/19 Time [place]: 1406 Representative spoke with at [place]: [place] (SDOH) Interventions     Readmission Risk Interventions No flowsheet data found.

## 2019-09-09 NOTE — ED Notes (Signed)
Called to request deaf interpreter

## 2019-09-09 NOTE — ED Notes (Signed)
Sign language interpreter at bedside  

## 2019-09-09 NOTE — Discharge Instructions (Signed)
You were seen in the emergency department today with nausea, abdominal pain, lightheadedness.  Your CT scan, lab work, chest x-ray, heart labs were normal.  I am starting you on a blood pressure medication as well as a medication for nausea and for reflux.  Please call your primary care doctor to schedule the next available appointment for follow-up.  I have asked to the social worker to arrange home health.  Please return to the emergency department with any new or suddenly worsening symptoms.

## 2019-09-09 NOTE — TOC Initial Note (Signed)
Transition of Care [facility]) - Initial/Assessment Note    Patient Details  Name: Janet Moore MRN: [id number] Date of Birth: 08/31/1946  Transition of [facility]) CM/SW Contact:    Fuller Mandril, RN Phone Number: 09/09/2019, 2:08 PM  Clinical Narrative:                 [facility] consulted to assist with [facility] set-up  Expected Discharge Plan: [place] Barriers to Discharge: Barriers Resolved   Patient Goals and CMS Choice        Expected Discharge Plan and Services Expected Discharge Plan: [place]   Discharge Planning Services: CM Consult   Living arrangements for the past 2 months: [place] Arranged: RN [facility] Date [place]: 09/09/19 Time [place]: 1540 Representative spoke with at [place]: Glyn Ade  Prior Living Arrangements/Services Living arrangements for the past 2 months: [place] Lives with:: Self Patient language and need for interpreter reviewed:: Yes(sign language interpreter [place]) Do you feel safe going back to the place where you live?: Yes               Activities of Daily Living      Permission Sought/Granted Permission sought to share information with : Case Manager, Customer service manager Permission granted to share information with : Yes, Verbal Permission Granted     Permission granted to share info w AGENCY: HH agency        Emotional Assessment Appearance:: Appears stated age Attitude/Demeanor/Rapport: Gracious Affect (typically observed): Accepting, Appropriate Orientation: : Oriented to Self, Oriented to Place, Oriented to  Time, Oriented to Situation Alcohol / Substance Use: Never Used Psych Involvement: No (comment)  Admission diagnosis:  abd pain Patient Active Problem List   Diagnosis Date Noted  . Chest pain 09/30/2012  . GERD (gastroesophageal reflux  disease) 09/30/2012   PCP:  Maury Dus, MD Pharmacy:   [facility], [place] [address] Phone: [phone] Fax: [phone]  [facility] [address] [place] [place], [place] [address] Phone: [phone] Fax: [phone]     Social Determinants of Health (SDOH) Interventions    Readmission Risk Interventions No flowsheet data found.

## 2019-09-09 NOTE — ED Notes (Signed)
Called pt x2 for room, no response. 

## 2019-11-23 DIAGNOSIS — Z7901 Long term (current) use of anticoagulants: Secondary | ICD-10-CM | POA: Diagnosis not present

## 2019-11-23 DIAGNOSIS — M19012 Primary osteoarthritis, left shoulder: Secondary | ICD-10-CM | POA: Diagnosis not present

## 2019-11-23 DIAGNOSIS — Z6834 Body mass index (BMI) 34.0-34.9, adult: Secondary | ICD-10-CM | POA: Diagnosis not present

## 2019-11-23 DIAGNOSIS — E669 Obesity, unspecified: Secondary | ICD-10-CM | POA: Diagnosis not present

## 2019-11-23 DIAGNOSIS — R9431 Abnormal electrocardiogram [ECG] [EKG]: Secondary | ICD-10-CM | POA: Diagnosis not present

## 2019-11-23 DIAGNOSIS — E785 Hyperlipidemia, unspecified: Secondary | ICD-10-CM | POA: Diagnosis not present

## 2019-11-23 DIAGNOSIS — H913 Deaf nonspeaking, not elsewhere classified: Secondary | ICD-10-CM | POA: Diagnosis not present

## 2019-11-23 DIAGNOSIS — I4510 Unspecified right bundle-branch block: Secondary | ICD-10-CM | POA: Diagnosis not present

## 2019-11-23 DIAGNOSIS — M25512 Pain in left shoulder: Secondary | ICD-10-CM | POA: Diagnosis not present

## 2020-02-29 DIAGNOSIS — N39498 Other specified urinary incontinence: Secondary | ICD-10-CM | POA: Diagnosis not present

## 2020-02-29 DIAGNOSIS — I10 Essential (primary) hypertension: Secondary | ICD-10-CM | POA: Diagnosis not present

## 2020-02-29 DIAGNOSIS — Z590 Homelessness: Secondary | ICD-10-CM | POA: Diagnosis not present

## 2020-02-29 DIAGNOSIS — H913 Deaf nonspeaking, not elsewhere classified: Secondary | ICD-10-CM | POA: Diagnosis not present

## 2020-11-15 IMAGING — CT CT ABD-PELV W/ CM
2 of 5 series · 16 of 46 positions shown, 18 images · IV contrast (omnipaque)
Comparison: None.

CLINICAL DATA: Abdominal pain, nausea, vomiting and diarrhea
beginning today.

EXAM:
CT ABDOMEN AND PELVIS WITH CONTRAST
TECHNIQUE: Multidetector CT imaging of the abdomen and pelvis was performed
using the standard protocol following bolus administration of
intravenous contrast.
CONTRAST:  100 mL OMNIPAQUE IOHEXOL 300 MG/ML  SOLN

[Series 3: abd/ pelvis 5.0 i30f 2 · axial · 0.91mm/px · z∈[+801,+1226]mm · 13 of 97 slices shown, 15 images]
[im 6/97  soft-tissue]
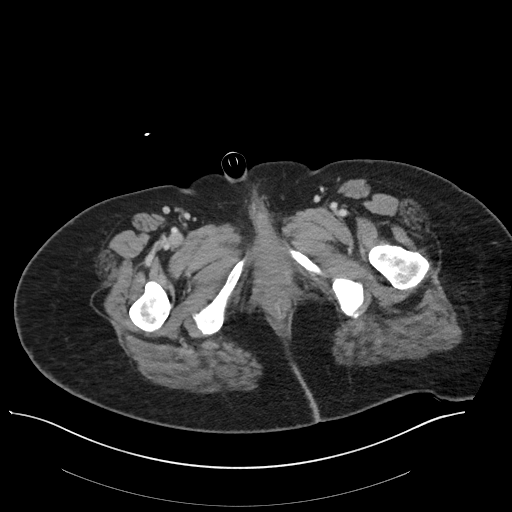
[im 6/97  bone]
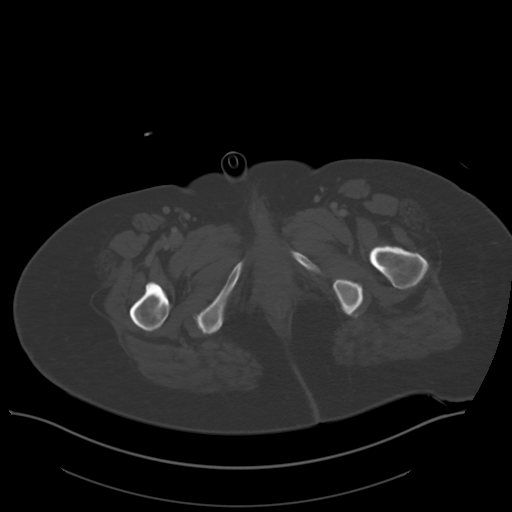
[im 16/97  soft-tissue]
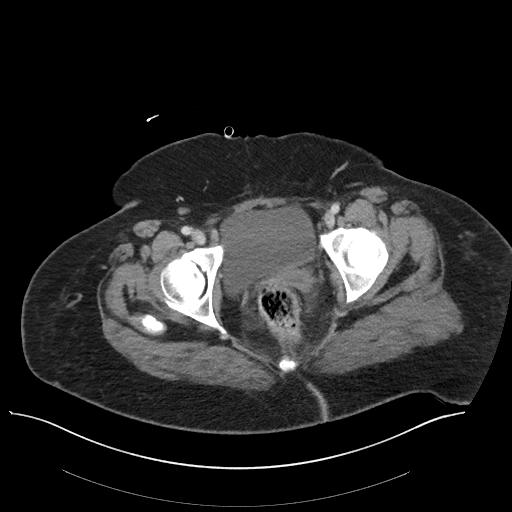
[im 21/97  soft-tissue]
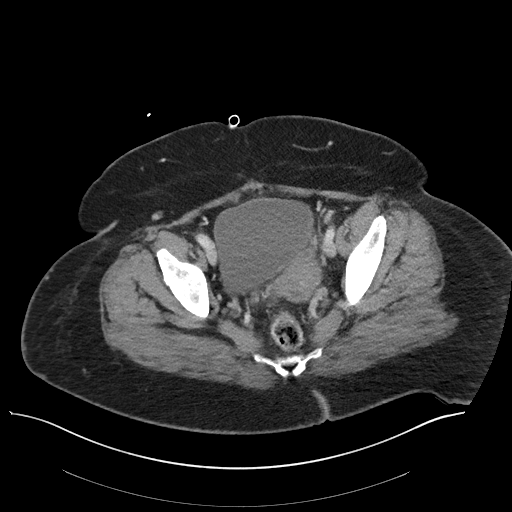
[im 26/97  soft-tissue]
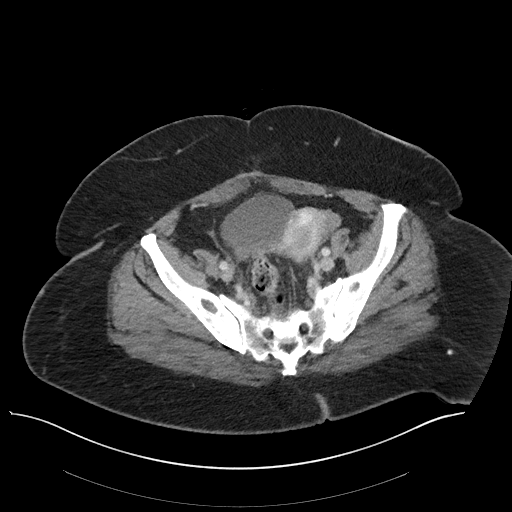
[im 36/97  soft-tissue]
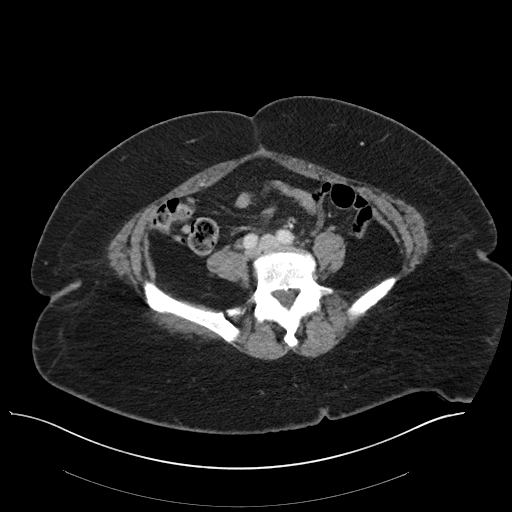
[im 41/97  soft-tissue]
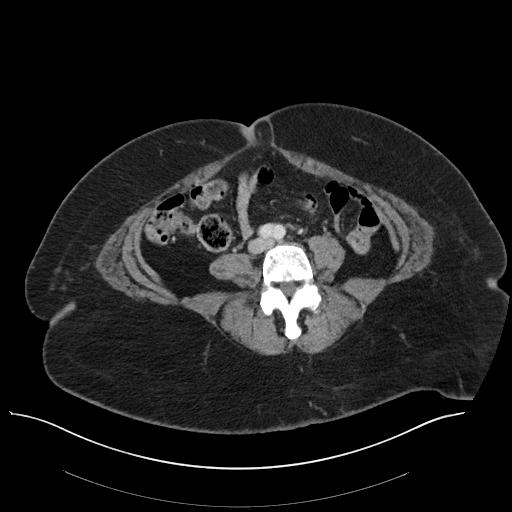
[im 51/97  soft-tissue]
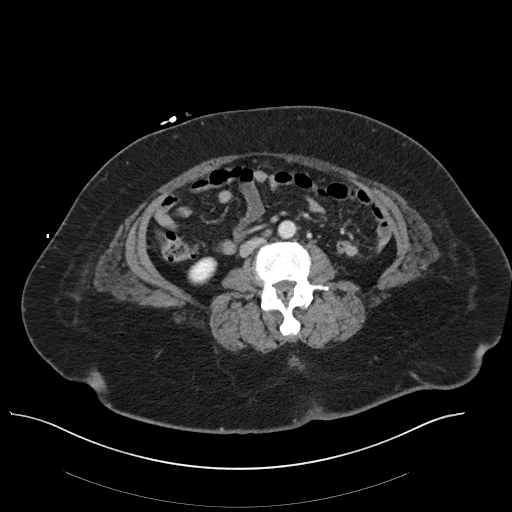
[im 56/97  soft-tissue]
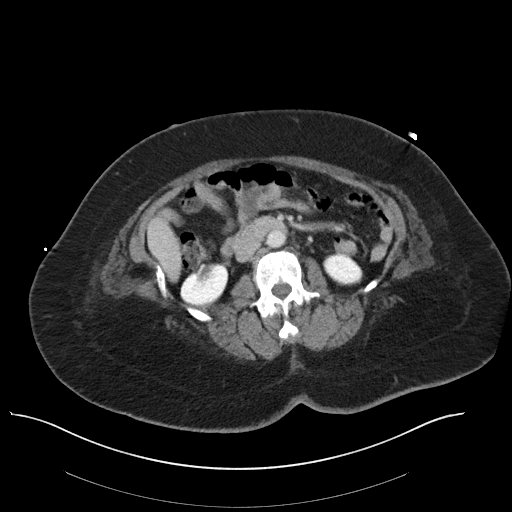
[im 61/97  soft-tissue]
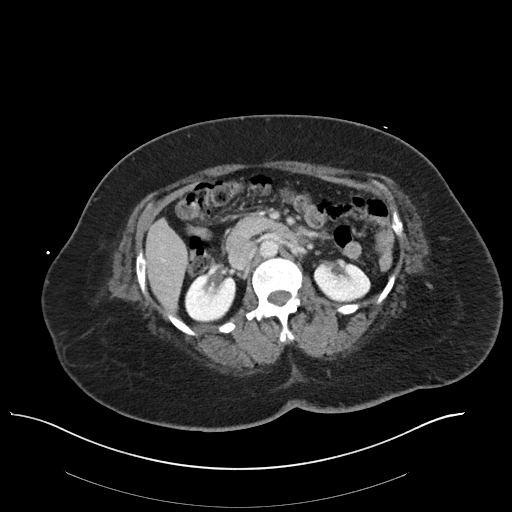
[im 61/97  bone]
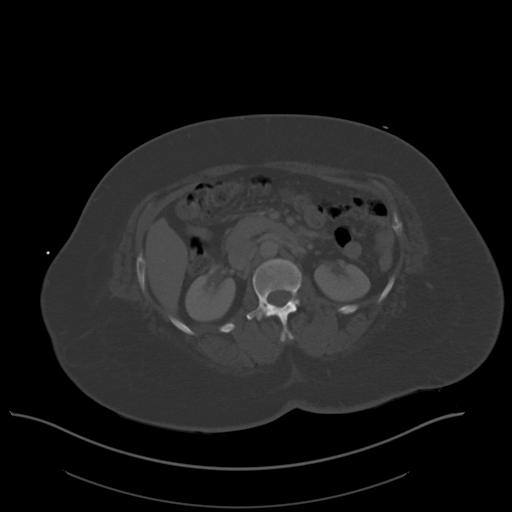
[im 71/97  soft-tissue]
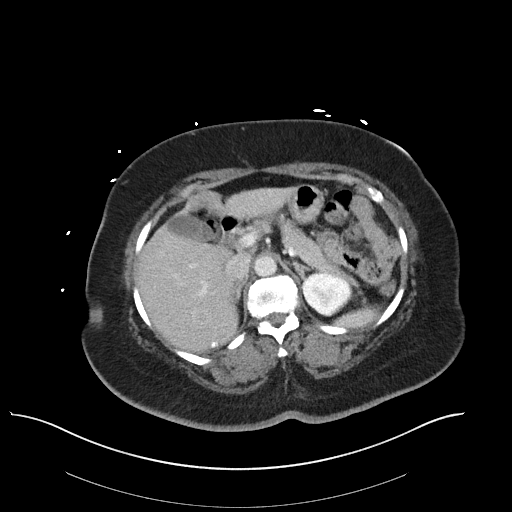
[im 76/97  soft-tissue]
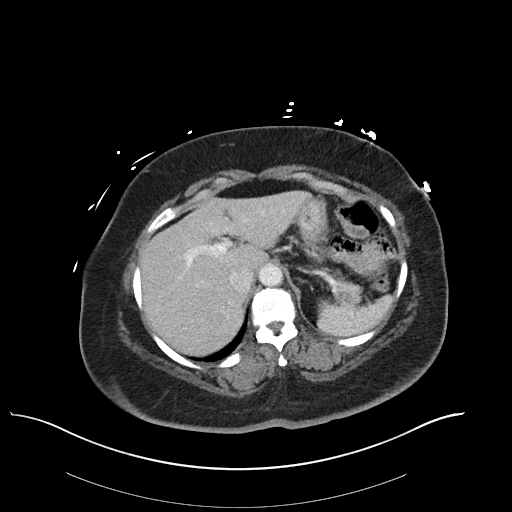
[im 81/97  soft-tissue]
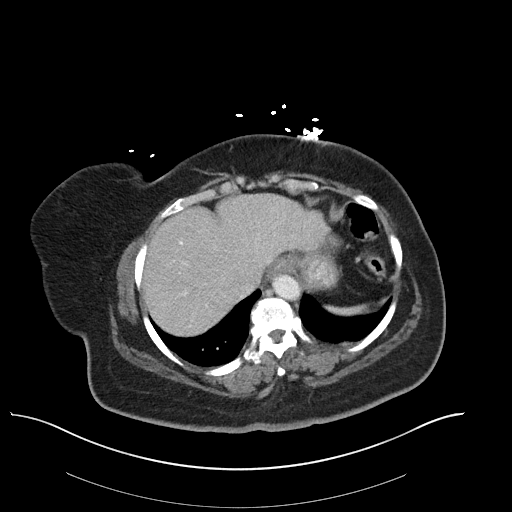
[im 91/97  soft-tissue]
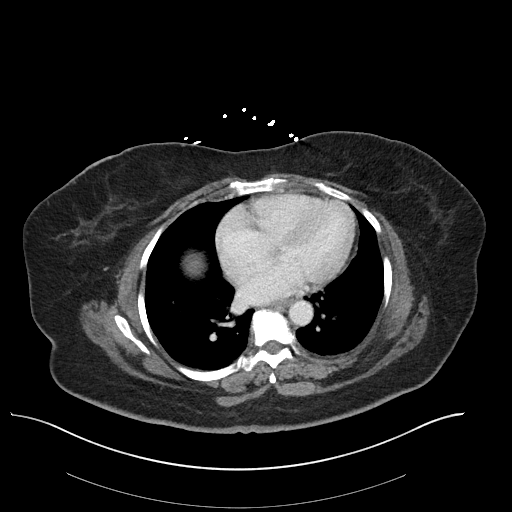

[Series 6: coronal soft tissue · coronal · 0.86mm/px · 3 of 101 slices shown]
[im 34/101  soft-tissue]
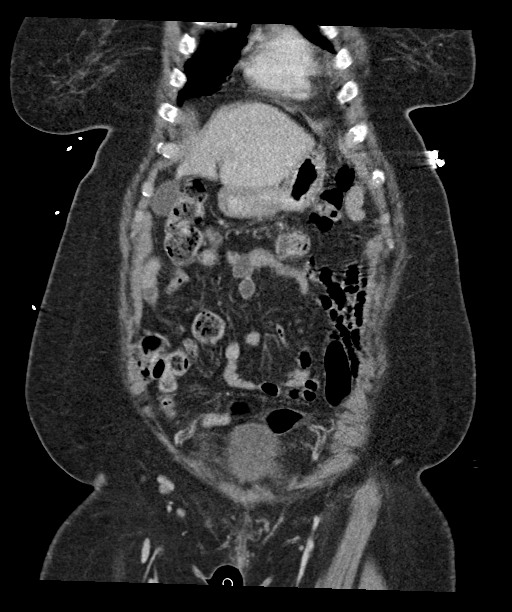
[im 45/101  soft-tissue]
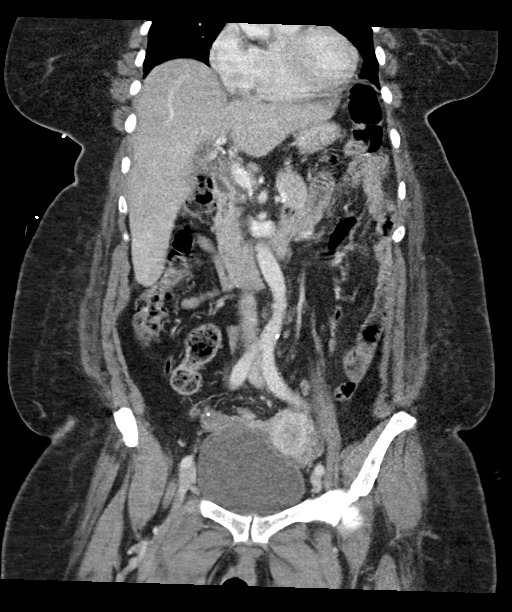
[im 56/101  soft-tissue]
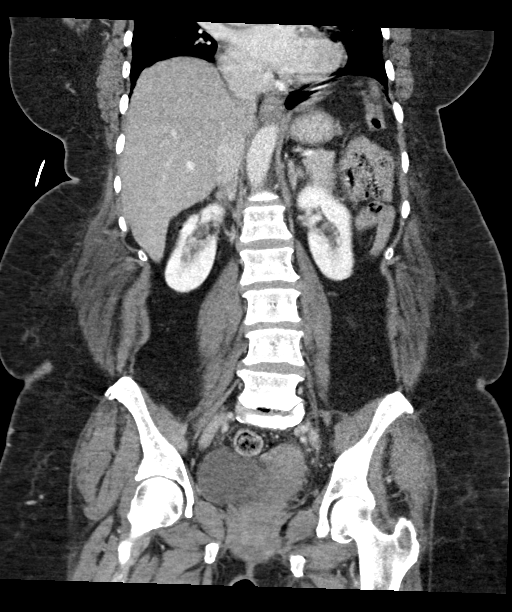

[16 of 46 positions shown; findings below may reference images not displayed]

FINDINGS: Lower chest: Very small bilateral pleural effusions are seen, larger
on the left. Heart size is enlarged. No pericardial effusion. Lung
bases clear.

Hepatobiliary: No focal liver abnormality is seen. No gallstones,
gallbladder wall thickening, or biliary dilatation.

Pancreas: Unremarkable. No pancreatic ductal dilatation or
surrounding inflammatory changes.

Spleen: Normal in size without focal abnormality.

Adrenals/Urinary Tract: Adrenal glands are unremarkable. Kidneys are
normal, without renal calculi, focal lesion, or hydronephrosis.
Bladder is unremarkable.

Stomach/Bowel: Stomach is within normal limits. Appendix appears
normal. No evidence of bowel wall thickening, distention, or
inflammatory changes. Scattered diverticula noted.

Vascular/Lymphatic: No significant vascular findings are present. No
enlarged abdominal or pelvic lymph nodes.

Reproductive: Uterus and bilateral adnexa are unremarkable.

Other: Small fat containing umbilical hernia is seen.

Musculoskeletal: No acute or focal bony abnormality. L5-S1
degenerative disc disease noted.
IMPRESSION: No acute abnormality abdomen or pelvis. No finding to explain the
patient's symptoms.

Mild diverticulosis without diverticulitis.

Small fat containing umbilical hernia.

## 2021-04-26 ENCOUNTER — Emergency Department (HOSPITAL_COMMUNITY)
Admission: EM | Admit: 2021-04-26 | Discharge: 2021-04-26 | Disposition: A | Discharge status: Home / Self Care | Payer: Medicare HMO | Attending: Emergency Medicine | Admitting: Emergency Medicine

## 2021-04-26 ENCOUNTER — Emergency Department (HOSPITAL_BASED_OUTPATIENT_CLINIC_OR_DEPARTMENT_OTHER)
Admit: 2021-04-26 | Discharge: 2021-04-26 | Disposition: A | Discharge status: Home / Self Care | Payer: Medicare HMO | Attending: Emergency Medicine | Admitting: Emergency Medicine

## 2021-04-26 ENCOUNTER — Other Ambulatory Visit: Payer: Self-pay

## 2021-04-26 ENCOUNTER — Encounter (HOSPITAL_COMMUNITY): Payer: Self-pay | Admitting: Emergency Medicine

## 2021-04-26 DIAGNOSIS — S80861A Insect bite (nonvenomous), right lower leg, initial encounter: Secondary | ICD-10-CM | POA: Insufficient documentation

## 2021-04-26 DIAGNOSIS — Z79899 Other long term (current) drug therapy: Secondary | ICD-10-CM | POA: Insufficient documentation

## 2021-04-26 DIAGNOSIS — R609 Edema, unspecified: Secondary | ICD-10-CM | POA: Diagnosis not present

## 2021-04-26 DIAGNOSIS — W57XXXA Bitten or stung by nonvenomous insect and other nonvenomous arthropods, initial encounter: Secondary | ICD-10-CM | POA: Insufficient documentation

## 2021-04-26 DIAGNOSIS — Z5900 Homelessness unspecified: Secondary | ICD-10-CM | POA: Diagnosis not present

## 2021-04-26 DIAGNOSIS — I10 Essential (primary) hypertension: Secondary | ICD-10-CM | POA: Diagnosis not present

## 2021-04-26 DIAGNOSIS — S8991XA Unspecified injury of right lower leg, initial encounter: Secondary | ICD-10-CM | POA: Diagnosis present

## 2021-04-26 DIAGNOSIS — S80862A Insect bite (nonvenomous), left lower leg, initial encounter: Secondary | ICD-10-CM | POA: Diagnosis not present

## 2021-04-26 DIAGNOSIS — M7989 Other specified soft tissue disorders: Secondary | ICD-10-CM

## 2021-04-26 LAB — CBC WITH DIFFERENTIAL/PLATELET
Abs Immature Granulocytes: 0.01 10*3/uL (ref 0.00–0.07)
Basophils Absolute: 0 10*3/uL (ref 0.0–0.1)
Basophils Relative: 1 %
Eosinophils Absolute: 0.1 10*3/uL (ref 0.0–0.5)
Eosinophils Relative: 1 %
HCT: 49.4 % — ABNORMAL HIGH (ref 36.0–46.0)
Hemoglobin: 16.1 g/dL — ABNORMAL HIGH (ref 12.0–15.0)
Immature Granulocytes: 0 %
Lymphocytes Relative: 12 %
Lymphs Abs: 0.9 10*3/uL (ref 0.7–4.0)
MCH: 31 pg (ref 26.0–34.0)
MCHC: 32.6 g/dL (ref 30.0–36.0)
MCV: 95 fL (ref 80.0–100.0)
Monocytes Absolute: 0.3 10*3/uL (ref 0.1–1.0)
Monocytes Relative: 4 %
Neutro Abs: 6.3 10*3/uL (ref 1.7–7.7)
Neutrophils Relative %: 82 %
Platelets: 282 10*3/uL (ref 150–400)
RBC: 5.2 MIL/uL — ABNORMAL HIGH (ref 3.87–5.11)
RDW: 14.4 % (ref 11.5–15.5)
WBC: 7.6 10*3/uL (ref 4.0–10.5)
nRBC: 0 % (ref 0.0–0.2)

## 2021-04-26 LAB — BASIC METABOLIC PANEL
Anion gap: 6 (ref 5–15)
BUN: 12 mg/dL (ref 8–23)
CO2: 31 mmol/L (ref 22–32)
Calcium: 9.5 mg/dL (ref 8.9–10.3)
Chloride: 103 mmol/L (ref 98–111)
Creatinine, Ser: 0.75 mg/dL (ref 0.44–1.00)
GFR, Estimated: >60 mL/min (ref >60)
Glucose, Bld: 98 mg/dL (ref 70–99)
Potassium: 3.7 mmol/L (ref 3.5–5.1)
Sodium: 140 mmol/L (ref 135–145)

## 2021-04-26 MED ORDER — HYDROCORTISONE 0.5 % EX CREA
TOPICAL_CREAM | Freq: Once | CUTANEOUS | Status: AC
  Filled 2021-04-26: qty 28.35

## 2021-04-26 MED ORDER — HYDROCORTISONE 1 % EX CREA: TOPICAL_CREAM | CUTANEOUS | 0 refills | Status: AC

## 2021-04-26 NOTE — ED Triage Notes (Signed)
Patient presents form the [facility] with complaints of bilateral leg swelling for 2 weeks. She had a similar issue last year while living in [place].   EMS vitals: 176/98 BP 90 HR 18 Resp Rate 98% SPO2 on room air

## 2021-04-26 NOTE — ED Provider Notes (Signed)
[facility]-EMERGENCY DEPT Provider Note   CSN: [id number] Arrival date & time: 04/26/21  1306     History Chief Complaint  Patient presents with  . Leg Swelling    Janet Moore is a 75 y.o. female.  75 year old female with prior medical history as detailed below presents for evaluation.  ASL interpreter used for history.  Patient is homeless.  She reports that she was sleeping outside for the last several weeks.  She reports that she arrived here from [place], [place] approximately 2 months ago.  She complains of mild bilateral lower extremity edema.  She also complains of some red " bumps" on her right shin.  She thinks that these may be a bug bite. Swelling appears chronic in nature. Patient noted "bumps" yesterday.   She denies fever.  She denies difficulty breathing.  She denies chest pain.  She denies other specific complaint.  The history is provided by the patient and medical records. A language interpreter was used.  Illness Location:  Swelling to both legs, "red bumps" on right shin Severity:  Mild Onset quality:  Gradual Duration:  2 weeks Timing:  Unable to specify Progression:  Unchanged Chronicity:  New      Past Medical History:  Diagnosis Date  . Deaf   . GERD (gastroesophageal reflux disease)   . Hypertension     Patient Active Problem List   Diagnosis Date Noted  . Chest pain 09/30/2012  . GERD (gastroesophageal reflux disease) 09/30/2012    Past Surgical History:  Procedure Laterality Date  . CESAREAN SECTION     x2  . TUBAL LIGATION       OB History   No obstetric history on file.     History reviewed. No pertinent family history.  Social History   Tobacco Use  . Smoking status: Never Smoker  . Smokeless tobacco: Never Used  Substance Use Topics  . Alcohol use: No  . Drug use: No    Home Medications Prior to Admission medications   Medication Sig Start Date End Date Taking? Authorizing Provider   amLODipine (NORVASC) 2.5 MG tablet Take 1 tablet (2.5 mg total) by mouth daily. 09/09/19 10/09/19  Long, Arlyss Repress, MD  famotidine (PEPCID) 20 MG tablet Take 1 tablet (20 mg total) by mouth daily. 09/09/19 10/09/19  Long, Arlyss Repress, MD  ondansetron (ZOFRAN ODT) 4 MG disintegrating tablet Take 1 tablet (4 mg total) by mouth every 8 (eight) hours as needed for nausea or vomiting. 09/09/19   LongArlyss Repress, MD    Allergies    Aspirin, Ciprofloxacin, and Ibuprofen  Review of Systems   Review of Systems  All other systems reviewed and are negative.   Physical Exam Updated Vital Signs Ht 5\' 4"  (1.626 m)   Wt 86.2 kg   BMI 32.61 kg/m   Physical Exam Vitals and nursing note reviewed.  Constitutional:      General: She is not in acute distress.    Appearance: Normal appearance. She is well-developed.  HENT:     Head: Normocephalic and atraumatic.  Eyes:     Conjunctiva/sclera: Conjunctivae normal.     Pupils: Pupils are equal, round, and reactive to light.  Cardiovascular:     Rate and Rhythm: Normal rate and regular rhythm.     Heart sounds: Normal heart sounds.  Pulmonary:     Effort: Pulmonary effort is normal. No respiratory distress.     Breath sounds: Normal breath sounds.  Abdominal:  General: There is no distension.     Palpations: Abdomen is soft.     Tenderness: There is no abdominal tenderness.  Musculoskeletal:        General: No deformity. Normal range of motion.     Cervical back: Normal range of motion and neck supple.     Right lower leg: Edema present.     Left lower leg: Edema present.     Comments: 1-2 plus edema to BLE - small area of mild erythema to anterior right shin - area of erythema is consistent with possible insect bite vs contact dermatitis.   Skin:    General: Skin is warm and dry.  Neurological:     General: No focal deficit present.     Mental Status: She is alert and oriented to person, place, and time.     ED Results / Procedures /  Treatments   Labs (all labs ordered are listed, but only abnormal results are displayed) Labs Reviewed  BASIC METABOLIC PANEL  CBC WITH DIFFERENTIAL/PLATELET    EKG None  Radiology No results found.  Procedures Procedures   Medications Ordered in ED Medications - No data to display  ED Course  I have reviewed the triage vital signs and the nursing notes.  Pertinent labs & imaging results that were available during my care of the patient were reviewed by me and considered in my medical decision making (see chart for details).    MDM Rules/Calculators/A&P                          MDM  MSE complete  Janet Moore was evaluated in Emergency Department on 04/26/2021 for the symptoms described in the history of present illness. She was evaluated in the context of the global COVID-19 pandemic, which necessitated consideration that the patient might be at risk for infection with the SARS-CoV-2 virus that causes COVID-19. Institutional protocols and algorithms that pertain to the evaluation of patients at risk for COVID-19 are in a state of rapid change based on information released by regulatory bodies including the CDC and federal and state organizations. These policies and algorithms were followed during the patient's care in the ED.  Patient is complaining of insect bites to the right shin.  She also complains of mild bilateral lower extremity edema.  Exam is consistent with likely insect bites.  There is no evidence evidence of overlying cellulitis.  Work-up in the ED is without other significant acute abnormality.  Patient is advised to closely follow-up with regular care provider.  Discharge instructions performed with assistance of ASL interpreter.   Final Clinical Impression(s) / ED Diagnoses Final diagnoses:  Leg swelling  Insect bite, unspecified site, initial encounter    Rx / DC Orders ED Discharge Orders         Ordered    hydrocortisone cream 1 %         04/26/21 1557           Wynetta Fines, MD 04/26/21 1600

## 2021-04-26 NOTE — Progress Notes (Signed)
Bilateral lower extremity venous duplex has been completed. Preliminary results can be found in CV Proc through chart review.  Results were given to Dr. Rodena Medin.  04/26/21 2:08 PM Olen Cordial RVT

## 2021-04-26 NOTE — Discharge Instructions (Addendum)
Return for any problem.   Apply hydrocortisone cream to area of insect bite on right shin as instructed.

## 2021-05-10 ENCOUNTER — Other Ambulatory Visit: Payer: Self-pay

## 2021-05-10 ENCOUNTER — Emergency Department (HOSPITAL_COMMUNITY)
Admission: EM | Admit: 2021-05-10 | Discharge: 2021-05-11 | Disposition: A | Discharge status: Home / Self Care | Payer: Medicare PPO | Attending: Emergency Medicine | Admitting: Emergency Medicine

## 2021-05-10 DIAGNOSIS — Z79899 Other long term (current) drug therapy: Secondary | ICD-10-CM | POA: Diagnosis not present

## 2021-05-10 DIAGNOSIS — I10 Essential (primary) hypertension: Secondary | ICD-10-CM | POA: Insufficient documentation

## 2021-05-10 DIAGNOSIS — M542 Cervicalgia: Secondary | ICD-10-CM | POA: Diagnosis not present

## 2021-05-10 DIAGNOSIS — R52 Pain, unspecified: Secondary | ICD-10-CM

## 2021-05-11 ENCOUNTER — Emergency Department (HOSPITAL_COMMUNITY): Payer: Medicare PPO

## 2021-05-11 MED ORDER — PREDNISONE 10 MG PO TABS: 20.0000 mg | ORAL_TABLET | Freq: Two times a day (BID) | ORAL | 0 refills | Status: AC

## 2021-05-11 MED ORDER — KETOROLAC TROMETHAMINE 60 MG/2ML IM SOLN
INTRAMUSCULAR | Status: AC
  Filled 2021-05-11: qty 2

## 2021-05-11 MED ORDER — OXYCODONE-ACETAMINOPHEN 5-325 MG PO TABS
ORAL_TABLET | ORAL | Status: AC
  Filled 2021-05-11: qty 1

## 2021-05-11 MED ORDER — HYDROCODONE-ACETAMINOPHEN 5-325 MG PO TABS: 1.0000 | ORAL_TABLET | Freq: Four times a day (QID) | ORAL | 0 refills | Status: AC | PRN

## 2021-05-11 MED ORDER — PREDNISONE 20 MG PO TABS
ORAL_TABLET | ORAL | Status: AC
  Filled 2021-05-11: qty 2

## 2021-05-11 NOTE — ED Provider Notes (Signed)
[facility]-EMERGENCY DEPT Provider Note   CSN: [id number] Arrival date & time: 05/10/21  2329     History No chief complaint on file.   Janet Moore is a 75 y.o. female.  Patient is a 75 year old female with past medical history of hypertension, GERD, and deafness.  Patient presenting today for evaluation of neck pain.  This started earlier this evening in the absence of any injury or trauma.  She describes pain to the left side of the neck that is worse when she attempts to turn her head or move.  She denies any numbness or weakness in her arms or legs.  History taken with the assistance of the translator tablet.  The history is provided by the patient.      Past Medical History:  Diagnosis Date   Deaf    GERD (gastroesophageal reflux disease)    Hypertension     Patient Active Problem List   Diagnosis Date Noted   Chest pain 09/30/2012   GERD (gastroesophageal reflux disease) 09/30/2012    Past Surgical History:  Procedure Laterality Date   CESAREAN SECTION     x2   TUBAL LIGATION       OB History   No obstetric history on file.     No family history on file.  Social History   Tobacco Use   Smoking status: Never   Smokeless tobacco: Never  Substance Use Topics   Alcohol use: No   Drug use: No    Home Medications Prior to Admission medications   Medication Sig Start Date End Date Taking? Authorizing Provider  amLODipine (NORVASC) 2.5 MG tablet Take 1 tablet (2.5 mg total) by mouth daily. 09/09/19 10/09/19  Long, Arlyss Repress, MD  famotidine (PEPCID) 20 MG tablet Take 1 tablet (20 mg total) by mouth daily. 09/09/19 10/09/19  LongArlyss Repress, MD  hydrocortisone cream 1 % Apply to affected area 2 times daily 04/26/21   Wynetta Fines, MD  ondansetron (ZOFRAN ODT) 4 MG disintegrating tablet Take 1 tablet (4 mg total) by mouth every 8 (eight) hours as needed for nausea or vomiting. 09/09/19   LongArlyss Repress, MD    Allergies    Aspirin,  Ciprofloxacin, and Ibuprofen  Review of Systems   Review of Systems  All other systems reviewed and are negative.  Physical Exam Updated Vital Signs BP (!) 154/80 (BP Location: Right Arm)   Pulse 88   Temp 98 F (36.7 C) (Oral)   Resp 20   SpO2 99%   Physical Exam Vitals and nursing note reviewed.  Constitutional:      General: She is not in acute distress.    Appearance: She is well-developed. She is not diaphoretic.  HENT:     Head: Normocephalic and atraumatic.  Neck:     Comments: There is tenderness to palpation in the soft tissues of the left lateral neck.  There is no visible or palpable abnormality. Cardiovascular:     Rate and Rhythm: Normal rate and regular rhythm.     Heart sounds: No murmur heard.   No friction rub. No gallop.  Pulmonary:     Effort: Pulmonary effort is normal. No respiratory distress.     Breath sounds: Normal breath sounds. No wheezing.  Abdominal:     General: Bowel sounds are normal. There is no distension.     Palpations: Abdomen is soft.     Tenderness: There is no abdominal tenderness.  Musculoskeletal:  General: Normal range of motion.     Cervical back: Normal range of motion. Rigidity present.  Skin:    General: Skin is warm and dry.  Neurological:     Mental Status: She is alert and oriented to person, place, and time.     Sensory: No sensory deficit.     Motor: No weakness.     Coordination: Coordination normal.    ED Results / Procedures / Treatments   Labs (all labs ordered are listed, but only abnormal results are displayed) Labs Reviewed - No data to display  EKG None  Radiology No results found.  Procedures Procedures   Medications Ordered in ED Medications  predniSONE (DELTASONE) 20 MG tablet (has no administration in time range)  oxyCODONE-acetaminophen (PERCOCET/ROXICET) 5-325 MG per tablet (has no administration in time range)  ketorolac (TORADOL) 60 MG/2ML injection (has no administration in time  range)    ED Course  I have reviewed the triage vital signs and the nursing notes.  Pertinent labs & imaging results that were available during my care of the patient were reviewed by me and considered in my medical decision making (see chart for details).    MDM Rules/Calculators/A&P  Patient presenting with complaints of neck pain.  She describes sharp pain to the left side of her neck that began in the absence of any injury or trauma.  She is neurologically intact with no red flags that would suggest an emergent situation.  X-rays showed no acute process, but recommend CT for better visualization.  This was performed and shows degenerative changes, but nothing acute.  Patient given Toradol, prednisone, and Percocet and seems to be feeling somewhat better.  Patient to be discharged with a course of prednisone, pain medication, and follow-up with primary doctor if symptoms are not improving.  Final Clinical Impression(s) / ED Diagnoses Final diagnoses:  Pain    Rx / DC Orders ED Discharge Orders     None        Geoffery Lyons, MD 05/11/21 [phone]

## 2021-05-11 NOTE — ED Notes (Signed)
Pt got upset about getting DC this morning with a taxi.

## 2021-05-11 NOTE — Discharge Instructions (Addendum)
Begin taking prednisone as prescribed.  Begin taking hydrocodone as prescribed as needed for pain.  Follow-up with your primary doctor if symptoms are not improving in the next few days. 

## 2021-05-11 NOTE — ED Notes (Signed)
[facility] unable to take pt d/t pt lives in a motel.

## 2022-05-27 ENCOUNTER — Other Ambulatory Visit: Payer: Self-pay

## 2022-05-27 ENCOUNTER — Encounter (HOSPITAL_COMMUNITY): Payer: Self-pay | Admitting: Pharmacy Technician

## 2022-05-27 ENCOUNTER — Emergency Department (HOSPITAL_COMMUNITY)
Admission: EM | Admit: 2022-05-27 | Discharge: 2022-05-27 | Disposition: A | Discharge status: Home / Self Care | Payer: Medicare PPO | Attending: Emergency Medicine | Admitting: Emergency Medicine

## 2022-05-27 ENCOUNTER — Emergency Department (HOSPITAL_COMMUNITY): Payer: Medicare PPO

## 2022-05-27 DIAGNOSIS — B372 Candidiasis of skin and nail: Secondary | ICD-10-CM

## 2022-05-27 DIAGNOSIS — R109 Unspecified abdominal pain: Secondary | ICD-10-CM | POA: Diagnosis present

## 2022-05-27 LAB — URINALYSIS, ROUTINE W REFLEX MICROSCOPIC
Bacteria, UA: NONE SEEN
Bilirubin Urine: NEGATIVE
Glucose, UA: NEGATIVE mg/dL
Ketones, ur: 5 mg/dL — AB
Leukocytes,Ua: NEGATIVE
Nitrite: NEGATIVE
Protein, ur: NEGATIVE mg/dL
Specific Gravity, Urine: 1.011 (ref 1.005–1.030)
pH: 5 (ref 5.0–8.0)

## 2022-05-27 LAB — CBC
HCT: 42.6 % (ref 36.0–46.0)
Hemoglobin: 14 g/dL (ref 12.0–15.0)
MCH: 31 pg (ref 26.0–34.0)
MCHC: 32.9 g/dL (ref 30.0–36.0)
MCV: 94.5 fL (ref 80.0–100.0)
Platelets: 290 10*3/uL (ref 150–400)
RBC: 4.51 MIL/uL (ref 3.87–5.11)
RDW: 14 % (ref 11.5–15.5)
WBC: 6.9 10*3/uL (ref 4.0–10.5)
nRBC: 0 % (ref 0.0–0.2)

## 2022-05-27 LAB — COMPREHENSIVE METABOLIC PANEL
ALT: 15 U/L (ref 0–44)
AST: 21 U/L (ref 15–41)
Albumin: 3.4 g/dL — ABNORMAL LOW (ref 3.5–5.0)
Alkaline Phosphatase: 98 U/L (ref 38–126)
Anion gap: 11 (ref 5–15)
BUN: 9 mg/dL (ref 8–23)
CO2: 26 mmol/L (ref 22–32)
Calcium: 9.1 mg/dL (ref 8.9–10.3)
Chloride: 103 mmol/L (ref 98–111)
Creatinine, Ser: 0.95 mg/dL (ref 0.44–1.00)
GFR, Estimated: >60 mL/min (ref >60)
Glucose, Bld: 145 mg/dL — ABNORMAL HIGH (ref 70–99)
Potassium: 3.8 mmol/L (ref 3.5–5.1)
Sodium: 140 mmol/L (ref 135–145)
Total Bilirubin: 1.1 mg/dL (ref 0.3–1.2)
Total Protein: 7.2 g/dL (ref 6.5–8.1)

## 2022-05-27 LAB — LIPASE, BLOOD: Lipase: 22 U/L (ref 11–51)

## 2022-05-27 MED ORDER — FENTANYL CITRATE PF 50 MCG/ML IJ SOSY
12.5000 ug | PREFILLED_SYRINGE | Freq: Once | INTRAMUSCULAR | Status: AC
  Administered 2022-05-27: 12.5 ug via INTRAVENOUS
  Filled 2022-05-27: qty 1

## 2022-05-27 MED ORDER — KETOCONAZOLE-HYDROCORTISONE 2-2.5 % EX CREA: 1.0000 "application " | TOPICAL_CREAM | Freq: Three times a day (TID) | CUTANEOUS | 0 refills | Status: AC

## 2022-05-27 NOTE — ED Provider Notes (Signed)
[facility] EMERGENCY DEPARTMENT Provider Note   CSN: [id number] Arrival date & time: 05/27/22  4098     History  Chief Complaint  Patient presents with  . Abdominal Pain    Janet Moore is a 76 y.o. female.  HPI Level 5 caveat secondary to interpreter required 76 year old female who is deaf and required American sign language interpreter Patient complaining of burning in her abdomen.  Is unclear when this started.  Reported that she had surgery in 2020, but was unclear where this occurred.  I was unable to find documentation She reports having had a urinary tract infection it was unclear if the burning is referring to the abdomen or to when she urinates.    Home Medications Prior to Admission medications   Medication Sig Start Date End Date Taking? Authorizing Provider  Ketoconazole-Hydrocortisone 2-2.5 % CREA Apply 1 application  topically in the morning, at noon, and at bedtime. 05/27/22  Yes Margarita Grizzle, MD  amLODipine (NORVASC) 2.5 MG tablet Take 1 tablet (2.5 mg total) by mouth daily. 09/09/19 10/09/19  Long, Arlyss Repress, MD  famotidine (PEPCID) 20 MG tablet Take 1 tablet (20 mg total) by mouth daily. 09/09/19 10/09/19  Long, Arlyss Repress, MD  HYDROcodone-acetaminophen (NORCO) 5-325 MG tablet Take 1-2 tablets by mouth every 6 (six) hours as needed. 05/11/21   Geoffery Lyons, MD  hydrocortisone cream 1 % Apply to affected area 2 times daily 04/26/21   Wynetta Fines, MD  ondansetron (ZOFRAN ODT) 4 MG disintegrating tablet Take 1 tablet (4 mg total) by mouth every 8 (eight) hours as needed for nausea or vomiting. 09/09/19   Long, Arlyss Repress, MD  predniSONE (DELTASONE) 10 MG tablet Take 2 tablets (20 mg total) by mouth 2 (two) times daily with a meal. 05/11/21   Geoffery Lyons, MD      Allergies    Aspirin, Ciprofloxacin, and Ibuprofen    Review of Systems   Review of Systems  Physical Exam Updated Vital Signs BP 137/88 (BP Location: Right Arm)   Pulse 70   Temp  98.1 F (36.7 C) (Oral)   Resp 16   SpO2 99%  Physical Exam Vitals and nursing note reviewed.  Constitutional:      Appearance: She is well-developed. She is obese.  HENT:     Head: Normocephalic.     Mouth/Throat:     Mouth: Mucous membranes are moist.  Eyes:     Extraocular Movements: Extraocular movements intact.  Cardiovascular:     Rate and Rhythm: Normal rate and regular rhythm.     Heart sounds: Normal heart sounds.  Pulmonary:     Effort: Pulmonary effort is normal.     Breath sounds: Normal breath sounds.  Abdominal:     General: Abdomen is flat. Bowel sounds are normal.     Palpations: Abdomen is soft.     Comments: Patient with previous midline incision with some erythema but well-healed Lower abdomen has indurated with intertriginous areas erythematous with some skin breakdown noted Abdomen is soft and nontender Bowel sounds are normal  Skin:    General: Skin is warm and dry.     Capillary Refill: Capillary refill takes less than 2 seconds.  Neurological:     General: No focal deficit present.     Mental Status: She is alert.     ED Results / Procedures / Treatments   Labs (all labs ordered are listed, but only abnormal results are displayed) Labs Reviewed  COMPREHENSIVE  METABOLIC PANEL - Abnormal; Notable for the following components:      Result Value   Glucose, Bld 145 (*)    Albumin 3.4 (*)    All other components within normal limits  URINALYSIS, ROUTINE W REFLEX MICROSCOPIC - Abnormal; Notable for the following components:   Hgb urine dipstick MODERATE (*)    Ketones, ur 5 (*)    All other components within normal limits  LIPASE, BLOOD  CBC    EKG None  Radiology CT Renal Stone Study  Result Date: 05/27/2022 CLINICAL DATA:  Abdominal pain and burning.  Nausea and diarrhea. EXAM: CT ABDOMEN AND PELVIS WITHOUT CONTRAST TECHNIQUE: Multidetector CT imaging of the abdomen and pelvis was performed following the standard protocol without IV  contrast. RADIATION DOSE REDUCTION: This exam was performed according to the departmental dose-optimization program which includes automated exposure control, adjustment of the mA and/or kV according to patient size and/or use of iterative reconstruction technique. COMPARISON:  09/09/2019 FINDINGS: Motion degraded exam. Lower chest: Unremarkable Hepatobiliary: No suspicious focal abnormality in the liver on this study without intravenous contrast. There is no evidence for gallstones, gallbladder wall thickening, or pericholecystic fluid. No intrahepatic or extrahepatic biliary dilation. Pancreas: No focal mass lesion. No dilatation of the main duct. No intraparenchymal cyst. No peripancreatic edema. Spleen: No splenomegaly. No focal mass lesion. Adrenals/Urinary Tract: No adrenal nodule or mass. Assessment kidneys motion degraded but no definite stones evident. No hydroureteronephrosis. The urinary bladder appears normal for the degree of distention. Stomach/Bowel: Stomach is nondistended. Duodenum is normally positioned as is the ligament of Treitz. No small bowel wall thickening. No small bowel dilatation. The terminal ileum is normal. The appendix is normal. No gross colonic mass. No colonic wall thickening. Vascular/Lymphatic: There is mild atherosclerotic calcification of the abdominal aorta without aneurysm. There is no gastrohepatic or hepatoduodenal ligament lymphadenopathy. No retroperitoneal or mesenteric lymphadenopathy. Reproductive: Unremarkable. Other: No intraperitoneal free fluid. Musculoskeletal: Tiny paraumbilical hernia contains only fat. No worrisome lytic or sclerotic osseous abnormality. IMPRESSION: 1. Motion degraded exam. 2. No acute findings in the abdomen or pelvis. No findings to explain the patient's reported history of nausea and diarrhea. 3. Tiny paraumbilical hernia contains only fat. 4. Aortic Atherosclerosis (ICD10-I70.0). Electronically Signed   By: Kennith Center M.D.   On:  05/27/2022 12:29    Procedures Procedures    Medications Ordered in ED Medications  fentaNYL (SUBLIMAZE) injection 12.5 mcg (12.5 mcg Intravenous Given 05/27/22 1120)    ED Course/ Medical Decision Making/ A&P Clinical Course as of 05/27/22 1613  Sun May 27, 2022  1300 CT abdomen pelvis reviewed interpreted and no evidence of acute abnormality noted and radiology interpretation concurs [DR]  1612 CBC reviewed interpreted normal [DR]  1612 Comprehensive metabolic panel(!) Pleat metabolic panel reviewed interpreted and mild hyperglycemia glucose 145 otherwise within normal [DR]    Clinical Course User Index [DR] Margarita Grizzle, MD                           Medical Decision Making 76 year old female with abdominal complaints appear to be mainly in the lower abdomen and burning with urination Urinalysis obtained shows no evidence of acute UTI or infection Abdomen is soft and nontender Differential diagnosis includes intra-abdominal etiologies-including but not limited to appendicitis, diverticulitis,, stone, pyelonephritis, UTI Doubt acute intra-abdominal etiology with normal labs and exam CT obtained that shows no evidence of acute abnormality Urinalysis is clear I suspect that her burning is coming  from the intertrigo-patient given ketoconazole prescription and advised regarding keeping area clean and dry and other conservative therapy  Amount and/or Complexity of Data Reviewed Labs: ordered. Decision-making details documented in ED Course. Radiology: ordered and independent interpretation performed. Decision-making details documented in ED Course.  Risk Prescription drug management.            Final Clinical Impression(s) / ED Diagnoses Final diagnoses:  Intertriginous candidiasis    Rx / DC Orders ED Discharge Orders          Ordered    Ketoconazole-Hydrocortisone 2-2.5 % CREA  3 times daily        05/27/22 1411              Margarita Grizzle,  MD 05/27/22 [phone]
